# Patient Record
Sex: Male | Born: 1975 | Race: White | Hispanic: No | Marital: Married | Smoking: Never smoker
Health system: Southern US, Community
[De-identification: ages and names within clinical notes are randomized; demographics above are authoritative.]

## PROBLEM LIST (undated history)

## (undated) DIAGNOSIS — L409 Psoriasis, unspecified: Secondary | ICD-10-CM

## (undated) DIAGNOSIS — L739 Follicular disorder, unspecified: Secondary | ICD-10-CM

## (undated) DIAGNOSIS — E119 Type 2 diabetes mellitus without complications: Secondary | ICD-10-CM

## (undated) DIAGNOSIS — R7989 Other specified abnormal findings of blood chemistry: Secondary | ICD-10-CM

## (undated) DIAGNOSIS — G5750 Tarsal tunnel syndrome, unspecified lower limb: Secondary | ICD-10-CM

## (undated) DIAGNOSIS — M5030 Other cervical disc degeneration, unspecified cervical region: Secondary | ICD-10-CM

## (undated) DIAGNOSIS — E669 Obesity, unspecified: Secondary | ICD-10-CM

## (undated) DIAGNOSIS — E785 Hyperlipidemia, unspecified: Secondary | ICD-10-CM

## (undated) DIAGNOSIS — M5020 Other cervical disc displacement, unspecified cervical region: Secondary | ICD-10-CM

## (undated) HISTORY — DX: Hyperlipidemia, unspecified: E78.5

## (undated) HISTORY — DX: Follicular disorder, unspecified: L73.9

## (undated) HISTORY — PX: ANKLE SURGERY: SHX546

## (undated) HISTORY — DX: Obesity, unspecified: E66.9

## (undated) HISTORY — DX: Tarsal tunnel syndrome, unspecified lower limb: G57.50

## (undated) HISTORY — DX: Type 2 diabetes mellitus without complications: E11.9

## (undated) HISTORY — DX: Other cervical disc displacement, unspecified cervical region: M50.20

## (undated) HISTORY — DX: Other specified abnormal findings of blood chemistry: R79.89

## (undated) HISTORY — DX: Other cervical disc degeneration, unspecified cervical region: M50.30

## (undated) HISTORY — DX: Psoriasis, unspecified: L40.9

---

## 2011-03-22 ENCOUNTER — Ambulatory Visit: Payer: Self-pay | Admitting: Internal Medicine

## 2011-03-26 ENCOUNTER — Ambulatory Visit: Payer: Self-pay | Admitting: Internal Medicine

## 2011-06-06 ENCOUNTER — Ambulatory Visit: Payer: Self-pay

## 2014-10-11 ENCOUNTER — Ambulatory Visit: Payer: Self-pay | Admitting: Unknown Physician Specialty

## 2015-02-12 ENCOUNTER — Ambulatory Visit (INDEPENDENT_AMBULATORY_CARE_PROVIDER_SITE_OTHER): Payer: BLUE CROSS/BLUE SHIELD | Admitting: Family Medicine

## 2015-02-12 ENCOUNTER — Encounter: Payer: Self-pay | Admitting: Family Medicine

## 2015-02-12 ENCOUNTER — Other Ambulatory Visit: Payer: Self-pay

## 2015-02-12 VITALS — BP 121/82 | HR 68 | Temp 97.9°F | Ht 72.44 in | Wt 284.0 lb

## 2015-02-12 DIAGNOSIS — H1013 Acute atopic conjunctivitis, bilateral: Secondary | ICD-10-CM

## 2015-02-12 DIAGNOSIS — E1165 Type 2 diabetes mellitus with hyperglycemia: Secondary | ICD-10-CM

## 2015-02-12 DIAGNOSIS — E291 Testicular hypofunction: Secondary | ICD-10-CM

## 2015-02-12 DIAGNOSIS — E669 Obesity, unspecified: Secondary | ICD-10-CM | POA: Diagnosis not present

## 2015-02-12 DIAGNOSIS — J3089 Other allergic rhinitis: Secondary | ICD-10-CM | POA: Diagnosis not present

## 2015-02-12 DIAGNOSIS — E785 Hyperlipidemia, unspecified: Secondary | ICD-10-CM | POA: Insufficient documentation

## 2015-02-12 DIAGNOSIS — R7989 Other specified abnormal findings of blood chemistry: Secondary | ICD-10-CM | POA: Insufficient documentation

## 2015-02-12 DIAGNOSIS — L739 Follicular disorder, unspecified: Secondary | ICD-10-CM | POA: Diagnosis not present

## 2015-02-12 DIAGNOSIS — H1010 Acute atopic conjunctivitis, unspecified eye: Secondary | ICD-10-CM | POA: Insufficient documentation

## 2015-02-12 DIAGNOSIS — J309 Allergic rhinitis, unspecified: Secondary | ICD-10-CM | POA: Insufficient documentation

## 2015-02-12 DIAGNOSIS — IMO0002 Reserved for concepts with insufficient information to code with codable children: Secondary | ICD-10-CM

## 2015-02-12 DIAGNOSIS — L239 Allergic contact dermatitis, unspecified cause: Secondary | ICD-10-CM | POA: Insufficient documentation

## 2015-02-12 DIAGNOSIS — B3742 Candidal balanitis: Secondary | ICD-10-CM | POA: Insufficient documentation

## 2015-02-12 DIAGNOSIS — L200 Besnier's prurigo: Secondary | ICD-10-CM | POA: Diagnosis not present

## 2015-02-12 MED ORDER — GLIMEPIRIDE 4 MG PO TABS: 4.0000 mg | ORAL_TABLET | Freq: Two times a day (BID) | ORAL | Status: DC

## 2015-02-12 MED ORDER — TRIAMCINOLONE ACETONIDE 0.1 % EX CREA: 1.0000 | TOPICAL_CREAM | Freq: Two times a day (BID) | CUTANEOUS | Status: DC | PRN

## 2015-02-12 MED ORDER — METFORMIN HCL ER 500 MG PO TB24: 1500.0000 mg | ORAL_TABLET | Freq: Every day | ORAL | Status: DC

## 2015-02-12 MED ORDER — SIMVASTATIN 40 MG PO TABS: 40.0000 mg | ORAL_TABLET | Freq: Every day | ORAL | Status: DC

## 2015-02-12 NOTE — Telephone Encounter (Signed)
He needs new diabetic testing supplies.

## 2015-02-12 NOTE — Patient Instructions (Addendum)
Please do trim the fingernails pretty close without reaching the quick, obviously Try corticosteroid cream before bed to see if that will turn off the itch Try to limit saturated fats (bacon, sausage, cheese, hot dogs, etc.) Try veggie hot dogs Check out the information at familydoctor.org entitled "What It Takes to Lose Weight" Try to lose between 1-2 pounds per week by taking in fewer calories and burning off more calories You can succeed by limiting portions, limiting foods dense in calories and fat, becoming more active, and drinking 8 glasses of water a day Don't skip meals, especially breakfast, as skipping meals may alter your metabolism Do not use over-the-counter weight loss pills or gimmicks that claim rapid weight loss A healthy BMI (or body mass index) is between 18.5 and 24.9 You can calculate your ideal BMI at the [place] website ClubMonetize.fr If you have not heard anything from my staff in a week about any orders/referrals/studies from today, please contact us here to follow-up [phone]

## 2015-02-12 NOTE — Progress Notes (Signed)
BP 121/82 mmHg  Pulse 68  Temp(Src) 97.9 F (36.6 C)  Ht 6' 0.44" (1.84 m)  Wt 284 lb (128.822 kg)  BMI 38.05 kg/m2  SpO2 98%   Subjective:    Patient ID: Donald Baxter, male    DOB: Dec 06, 1975, 39 y.o.   MRN: [id number]  HPI: Donald Baxter is a 39 y.o. male  Chief Complaint  Patient presents with  . Establish Care  . Allergic Rhinitis   . Diabetes   I need to get my diabetes under control; he has had diabetes since around 2008; numbers stay around 250 and he knows that is high; they've tried all kinds of medicines; doesn't come down much; very stable, no highs and lows; feels like they try to overmedicate him sometimes He broke his last meter; would like meter for OneTouch Veriflex, strips, lancets  Allergies and skin rash; sees dermatologist; does not use fragrance-free detergents; dermatologist gave him some bath soap; just OTC allegra, which helps the sneezing and coughing; he would be interested in getting tested for allergies; two dogs at home, not really long-haired, medium hair; chihuahua sleeps in the bed; older double wide home; thinks there might be mold in the house, owned by father-in-law  Occasional flares of 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 Malvin Johns was his surgeon at [place] to take medicine for low testosterone  Relevant past medical, surgical, family and social history reviewed and updated as indicated. Interim medical history since our last visit reviewed. Allergies and medications reviewed and updated.  Meds:  Aspirin, allegra, amaryl, glucophage 1500 mg, simvastatin 40 (morning)  Review of Systems  Constitutional: Negative for unexpected weight change.  Respiratory: Negative.   Cardiovascular: Negative.   Gastrointestinal: Negative for nausea and abdominal pain.  Musculoskeletal: Negative for neck pain.  Neurological: Negative.    Per HPI unless  specifically indicated above     Objective:    BP 121/82 mmHg  Pulse 68  Temp(Src) 97.9 F (36.6 C)  Ht 6' 0.44" (1.84 m)  Wt 284 lb (128.822 kg)  BMI 38.05 kg/m2  SpO2 98%  Wt Readings from Last 3 Encounters:  02/12/15 284 lb (128.822 kg)    Physical Exam  Constitutional: He appears well-developed and well-nourished. No distress.  Cardiovascular: Normal rate, regular rhythm and normal heart sounds.   Pulmonary/Chest: Effort normal and breath sounds normal.  Neurological: He is alert.  Skin: He is not diaphoretic.  Psychiatric: He has a normal mood and affect. His behavior is normal. Judgment and thought content normal.   Diabetic Foot Form - Detailed   Diabetic Foot Exam - detailed  Diabetic Foot exam was performed with the following findings:  Yes 02/12/2015  9:53 AM  Are the toenails long?:  No  Are the toenails thick?:  No  Are the toenails ingrown?:  No    Pulse Foot Exam completed.:  Yes  Right Dorsalis Pedis:  Present Left Dorsalis Pedis:  Present  Sensory Foot Exam Completed.:  Yes  Semmes-Weinstein Monofilament Test  R Site 1-Great Toe:  Pos L Site 1-Great Toe:  Pos  R Site 4:  Pos L Site 4:  Pos  R Site 5:  Pos L Site 5:  Pos        No results found for this or any previous visit.    Assessment & Plan:   Problem List Items Addressed  This Visit      Respiratory   Allergic rhinitis     Endocrine   Diabetes mellitus with hyperglycemia - Primary   Relevant Medications   aspirin EC 81 MG tablet   metFORMIN (GLUCOPHAGE-XR) 500 MG 24 hr tablet   glimepiride (AMARYL) 4 MG tablet   simvastatin (ZOCOR) 40 MG tablet   Other Relevant Orders   Hgb A1c w/o eAG   Comprehensive metabolic panel     Musculoskeletal and Integument   Folliculitis   Allergic dermatitis     Other   Low testosterone   Obesity   Relevant Medications   metFORMIN (GLUCOPHAGE-XR) 500 MG 24 hr tablet   glimepiride (AMARYL) 4 MG tablet   Other Relevant Orders   TSH   Dyslipidemia    Relevant Medications   simvastatin (ZOCOR) 40 MG tablet   Other Relevant Orders   Lipid Panel w/o Chol/HDL Ratio   Allergic conjunctivitis      Refills to [facility] in [place]  Follow up plan: Return in about 3 months (around 05/15/2015) for diabetes.

## 2015-02-13 ENCOUNTER — Telehealth: Payer: Self-pay | Admitting: Family Medicine

## 2015-02-13 LAB — COMPREHENSIVE METABOLIC PANEL
ALT: 33 IU/L (ref 0–44)
AST: 19 IU/L (ref 0–40)
Albumin/Globulin Ratio: 1.7 (ref 1.1–2.5)
Albumin: 4 g/dL (ref 3.5–5.5)
Alkaline Phosphatase: 68 IU/L (ref 39–117)
BUN/Creatinine Ratio: 12 (ref 9–20)
BUN: 12 mg/dL (ref 6–24)
Bilirubin Total: 0.4 mg/dL (ref 0.0–1.2)
CHLORIDE: 100 mmol/L (ref 97–108)
CO2: 23 mmol/L (ref 18–29)
CREATININE: 1.03 mg/dL (ref 0.76–1.27)
Calcium: 9.3 mg/dL (ref 8.7–10.2)
GFR calc Af Amer: 105 mL/min/{1.73_m2} (ref >59)
GFR calc non Af Amer: 90 mL/min/{1.73_m2} (ref >59)
GLUCOSE: 213 mg/dL — AB (ref 65–99)
Globulin, Total: 2.4 g/dL (ref 1.5–4.5)
POTASSIUM: 4.5 mmol/L (ref 3.5–5.2)
Sodium: 138 mmol/L (ref 134–144)
Total Protein: 6.4 g/dL (ref 6.0–8.5)

## 2015-02-13 LAB — HGB A1C W/O EAG: HEMOGLOBIN A1C: 10.3 % — AB (ref 4.8–5.6)

## 2015-02-13 LAB — LIPID PANEL W/O CHOL/HDL RATIO
CHOLESTEROL TOTAL: 169 mg/dL (ref 100–199)
HDL: 29 mg/dL — ABNORMAL LOW (ref >39)
LDL CALC: 74 mg/dL (ref 0–99)
Triglycerides: 330 mg/dL — ABNORMAL HIGH (ref 0–149)
VLDL CHOLESTEROL CAL: 66 mg/dL — AB (ref 5–40)

## 2015-02-13 LAB — TSH: TSH: 5 u[IU]/mL — ABNORMAL HIGH (ref 0.450–4.500)

## 2015-02-13 MED ORDER — ONETOUCH DELICA LANCETS FINE MISC: Status: DC

## 2015-02-13 MED ORDER — GLUCOSE BLOOD VI STRP: ORAL_STRIP | Status: DC

## 2015-02-13 MED ORDER — METFORMIN HCL ER 500 MG PO TB24: 2000.0000 mg | ORAL_TABLET | Freq: Every day | ORAL | Status: DC

## 2015-02-13 MED ORDER — ONETOUCH VERIO SYNC SYSTEM W/DEVICE KIT: PACK | Status: DC

## 2015-02-13 NOTE — Telephone Encounter (Signed)
Please let the patient know that his blood sugar is really uncontrolled I'd like to see him back in the office in the next two weeks to discuss other medicines In the meantime, I definitely recommend he increase the metformin to FOUR pills a day; I've sent new Rx Have him check his blood sugars once a day on average at different times (maybe before breakfast on Monday, before lunch on Tuesday, before evening meal Wed, before bed Thursday, skip Friday, etc.) I'd like to also do a few readings where he checks his sugar before he eats a meal, then checks it again 90-120 minutes after he started eating Bring all those readings in; they should average about one a day, so hopefully we'll have 14 or so readings to review Cholesterol was abnormal, so we'll go over that too at his appt Appt to go over all labs and adjust meds in 2 weeks

## 2015-02-14 LAB — ALLERGEN PROFILE, MINI-PANEL
Alternaria Alternata IgE: <0.1 kU/L
Bermuda Grass IgE: 1.76 kU/L — AB
Cat Dander IgE: 3.99 kU/L — AB
D001-IGE D PTERONYSSINUS: 76.8 kU/L — AB
D002-IGE D FARINAE: 49.7 kU/L — AB
Dog Dander IgE: 5.72 kU/L — AB
G008-IGE BLUEGRASS, KENTUCK: 16.1 kU/L — AB
Mouse Urine IgE: <0.1 kU/L
Oak, White IgE: <0.1 kU/L
Plantain, English IgE: 0.13 kU/L — AB
W001-IGE RAGWEED, SHORT: 0.6 kU/L — AB

## 2015-02-14 NOTE — Telephone Encounter (Signed)
Patient notified, appointment scheduled

## 2015-02-27 ENCOUNTER — Ambulatory Visit (INDEPENDENT_AMBULATORY_CARE_PROVIDER_SITE_OTHER): Payer: BLUE CROSS/BLUE SHIELD | Admitting: Family Medicine

## 2015-02-27 ENCOUNTER — Encounter: Payer: Self-pay | Admitting: Family Medicine

## 2015-02-27 VITALS — BP 112/79 | HR 76 | Temp 97.8°F | Wt 284.0 lb

## 2015-02-27 DIAGNOSIS — E785 Hyperlipidemia, unspecified: Secondary | ICD-10-CM

## 2015-02-27 DIAGNOSIS — R7989 Other specified abnormal findings of blood chemistry: Secondary | ICD-10-CM | POA: Insufficient documentation

## 2015-02-27 DIAGNOSIS — E669 Obesity, unspecified: Secondary | ICD-10-CM

## 2015-02-27 DIAGNOSIS — E1165 Type 2 diabetes mellitus with hyperglycemia: Secondary | ICD-10-CM

## 2015-02-27 DIAGNOSIS — R946 Abnormal results of thyroid function studies: Secondary | ICD-10-CM

## 2015-02-27 MED ORDER — CANAGLIFLOZIN 100 MG PO TABS: 100.0000 mg | ORAL_TABLET | Freq: Every day | ORAL | Status: DC

## 2015-02-27 NOTE — Progress Notes (Signed)
BP 112/79 mmHg  Pulse 76  Temp(Src) 97.8 F (36.6 C)  Wt 284 lb (128.822 kg)  SpO2 99%   Subjective:    Patient ID: Donald Baxter, male    DOB: [birth year], 39 y.o.   MRN: [id number]  HPI: CARLOSDANIEL Baxter is a 39 y.o. male  Chief Complaint  Patient presents with  . Diabetes  . Hyperlipidemia  Patient established care here  Diabetes mellitus type 2 Patient has had diabetes since 2008  Checking sugars?  yes How often? Pretty regularly, may skip a few days here and there Range (low to high) over last two weeks:  178 lowest and 326 highest; most below 250; lots in the 220-240s Does patient feel additional teaching/training would be helpful?  no; he already talked to a nutritionist; he can do the small things Some struggles, doesn't have time to eat meals except for grab and go; then has meal at home in the evening; can eat nuts out of vending machines, easy to grab, breakfast is harder because snacks get in there; hard to find a grab and go breakfast; tried muffins, not a large section of healthy foods that he can find; doesn't have time to cook; he gets tired of the same thing  Trying to limit white bread, white rice, white potatoes, sweets?  yes; always does brown rice; potatoes are the hardest, breads are pretty hard if he goes out to eat  Trying to limit sweetened drinks like iced tea, soft drinks, sports drinks, fruit juices?  yes; tries to stick to diet Coke Exercise/activity level:  occasional (maybe once or twice a week); not really regular exercise; does have a treadmill  Lab Results  Component Value Date   HGBA1C 10.3* 02/12/2015   High cholesterol Patient has had high cholesterol since many years Dietary intake: Do you eat more than 3 eggs per week  YES at times, maybe breakfast Do you try to limit saturated fats in your diet?  yes Does high cholesterol run in your family?   YES Weight:  If overweight or obese, are you trying to lose weight?  NO, not really Went from  315 pounds to 275 pounds; that was stress  Relevant past medical, surgical, family and social history reviewed and updated as indicated. Interim medical history since our last visit reviewed. Allergies and medications reviewed and updated.  Review of Systems  Per HPI unless specifically indicated above     Objective:    BP 112/79 mmHg  Pulse 76  Temp(Src) 97.8 F (36.6 C)  Wt 284 lb (128.822 kg)  SpO2 99%  Wt Readings from Last 3 Encounters:  02/27/15 284 lb (128.822 kg)  02/12/15 284 lb (128.822 kg)    Physical Exam  Constitutional: He appears well-developed and well-nourished. No distress.  obese  HENT:  Head: Normocephalic and atraumatic.  Eyes: EOM are normal. No scleral icterus.  Neck: No thyromegaly present.  Cardiovascular: Normal rate.   Pulmonary/Chest: Effort normal.  Abdominal: He exhibits no distension.  Musculoskeletal: He exhibits no edema.  Neurological: Coordination normal.  Skin: Skin is warm and dry. Rash (erythematous rash on the right leg (psoriasis)) noted. He is not diaphoretic.  Psychiatric: He has a normal mood and affect. His behavior is normal. Judgment and thought content normal.    Diabetic Foot Form - Detailed   Diabetic Foot Exam - detailed  Diabetic Foot exam was performed with the following findings:  Yes 02/27/2015 10:12 AM  Visual Foot Exam completed.:  Yes  Is there a history of foot ulcer?:  No  Are the toenails long?:  No  Are the toenails thick?:  No  Are the toenails ingrown?:  No    Pulse Foot Exam completed.:  Yes  Right Dorsalis Pedis:  Present Left Dorsalis Pedis:  Present  Semmes-Weinstein Monofilament Test  R Site 1-Great Toe:  Pos L Site 1-Great Toe:  Pos  R Site 4:  Pos L Site 4:  Pos    Comments:  Intact to vibration and monofilament testing both feet       Results for orders placed or performed in visit on 02/12/15  Hgb A1c w/o eAG  Result Value Ref Range   Hgb A1c MFr Bld 10.3 (H) 4.8 - 5.6 %  Lipid Panel  w/o Chol/HDL Ratio  Result Value Ref Range   Cholesterol, Total 169 100 - 199 mg/dL   Triglycerides 330 (H) 0 - 149 mg/dL   HDL 29 (L) >39 mg/dL   VLDL Cholesterol Cal 66 (H) 5 - 40 mg/dL   LDL Calculated 74 0 - 99 mg/dL  Comprehensive metabolic panel  Result Value Ref Range   Glucose 213 (H) 65 - 99 mg/dL   BUN 12 6 - 24 mg/dL   Creatinine, Ser 1.03 0.76 - 1.27 mg/dL   GFR calc non Af Amer 90 >59 mL/min/1.73   GFR calc Af Amer 105 >59 mL/min/1.73   BUN/Creatinine Ratio 12 9 - 20   Sodium 138 134 - 144 mmol/L   Potassium 4.5 3.5 - 5.2 mmol/L   Chloride 100 97 - 108 mmol/L   CO2 23 18 - 29 mmol/L   Calcium 9.3 8.7 - 10.2 mg/dL   Total Protein 6.4 6.0 - 8.5 g/dL   Albumin 4.0 3.5 - 5.5 g/dL   Globulin, Total 2.4 1.5 - 4.5 g/dL   Albumin/Globulin Ratio 1.7 1.1 - 2.5   Bilirubin Total 0.4 0.0 - 1.2 mg/dL   Alkaline Phosphatase 68 39 - 117 IU/L   AST 19 0 - 40 IU/L   ALT 33 0 - 44 IU/L  TSH  Result Value Ref Range   TSH 5.000 (H) 0.450 - 4.500 uIU/mL      Assessment & Plan:   Problem List Items Addressed This Visit      Endocrine   Diabetes mellitus with hyperglycemia - Primary    Encouraged healthy eating; limit white bread, continue metformin and amaryl; add invokana; savings card and 2 bottles of samples given; recheck A1C at follow-up; explained A1C concept, importance of getting to goal (under 7); increase activity      Relevant Medications   canagliflozin (INVOKANA) 100 MG TABS tablet     Other   Obesity    Will add invokana to see if this will help with weight loss and bring A1C down; encouraged activity; he commits to 30 minutes on the treadmill 3 days per week, healthy eating options discussed      Relevant Medications   canagliflozin (INVOKANA) 100 MG TABS tablet   Dyslipidemia    Continue fish oil to help, try healthier food options, ideas given      Abnormal thyroid blood test    Recheck thyroid at next visit in 3 months         Follow up  plan: Return in about 3 months (around 05/30/2015) for fasting labs and visit for diabetes and cholesterol.

## 2015-02-27 NOTE — Assessment & Plan Note (Signed)
Continue fish oil to help, try healthier food options, ideas given

## 2015-02-27 NOTE — Assessment & Plan Note (Addendum)
Encouraged healthy eating; limit white bread, continue metformin and amaryl; add invokana; savings card and 2 bottles of samples given; recheck A1C at follow-up; explained A1C concept, importance of getting to goal (under 7); increase activity

## 2015-02-27 NOTE — Assessment & Plan Note (Signed)
Recheck thyroid at next visit in 3 months

## 2015-02-27 NOTE — Assessment & Plan Note (Signed)
Will add invokana to see if this will help with weight loss and bring A1C down; encouraged activity; he commits to 30 minutes on the treadmill 3 days per week, healthy eating options discussed

## 2015-02-27 NOTE — Patient Instructions (Signed)
Do try some of the things we discussed Start new medicine Continue other medicines Try to walk for 30 minutes 3 times a week, and build up gradually if able; start slow and build up Return in 3 months, come fasting for labs If ever get really sick or dehydrated, do NOT take metformin or new medicine

## 2015-03-05 ENCOUNTER — Encounter: Payer: Self-pay | Admitting: Family Medicine

## 2015-05-15 ENCOUNTER — Ambulatory Visit: Payer: BLUE CROSS/BLUE SHIELD | Admitting: Family Medicine

## 2015-05-29 ENCOUNTER — Ambulatory Visit: Payer: BLUE CROSS/BLUE SHIELD | Admitting: Family Medicine

## 2015-06-04 ENCOUNTER — Ambulatory Visit (INDEPENDENT_AMBULATORY_CARE_PROVIDER_SITE_OTHER): Payer: BLUE CROSS/BLUE SHIELD | Admitting: Family Medicine

## 2015-06-04 ENCOUNTER — Encounter: Payer: Self-pay | Admitting: Family Medicine

## 2015-06-04 VITALS — BP 109/76 | HR 74 | Temp 97.2°F | Wt 284.0 lb

## 2015-06-04 DIAGNOSIS — E1165 Type 2 diabetes mellitus with hyperglycemia: Secondary | ICD-10-CM

## 2015-06-04 DIAGNOSIS — E669 Obesity, unspecified: Secondary | ICD-10-CM | POA: Diagnosis not present

## 2015-06-04 DIAGNOSIS — R946 Abnormal results of thyroid function studies: Secondary | ICD-10-CM | POA: Diagnosis not present

## 2015-06-04 DIAGNOSIS — M7989 Other specified soft tissue disorders: Secondary | ICD-10-CM | POA: Diagnosis not present

## 2015-06-04 DIAGNOSIS — H1013 Acute atopic conjunctivitis, bilateral: Secondary | ICD-10-CM

## 2015-06-04 DIAGNOSIS — R7989 Other specified abnormal findings of blood chemistry: Secondary | ICD-10-CM

## 2015-06-04 DIAGNOSIS — E785 Hyperlipidemia, unspecified: Secondary | ICD-10-CM | POA: Diagnosis not present

## 2015-06-04 NOTE — Progress Notes (Signed)
BP 109/76 mmHg  Pulse 74  Temp(Src) 97.2 F (36.2 C)  Wt 284 lb (128.822 kg)  SpO2 98%   Subjective:    Patient ID: Donald Baxter, male    DOB: 07/19/1976, 39 y.o.   MRN: [id number]  HPI: Donald Baxter is a 39 y.o. male  Chief Complaint  Patient presents with  . Diabetes  . Hyperlipidemia   Diabetes mellitus type 2 Patient has had diabetes since 2008 Checking sugars? yes How often? Pretty regularly, may skip a few days here and there Feels jittery when under 120; knows to eat when jittery; no extreme lows Expecting blood sugar to be much better Active at work Last eye exam was December; he won't go back there and he'll call to make his own appt with someone else  High cholesterol Patient has had high cholesterol since many years Dietary intake: Do you eat more than 3 eggs per week YES at times, maybe breakfast Do you try to limit saturated fats in your diet? yes Does high cholesterol run in your family? YES Weight:  If overweight or obese, are you trying to lose weight? NO, not really He feels like he is stuck in a plateau with his weight He tries to eat healthy when eating out, like subway over [employer], e.g.  Lab Results  Component Value Date   HGBA1C 10.3* 02/12/2015     Chemistry      Component Value Date/Time   NA 138 02/12/2015 0947   K 4.5 02/12/2015 0947   CL 100 02/12/2015 0947   CO2 23 02/12/2015 0947   BUN 12 02/12/2015 0947   CREATININE 1.03 02/12/2015 0947      Component Value Date/Time   CALCIUM 9.3 02/12/2015 0947   ALKPHOS 68 02/12/2015 0947   AST 19 02/12/2015 0947   ALT 33 02/12/2015 0947   BILITOT 0.4 02/12/2015 0947     Lab Results  Component Value Date   CHOL 169 02/12/2015   HDL 29* 02/12/2015   LDLCALC 74 02/12/2015   TRIG 330* 02/12/2015    Relevant past medical, surgical, family and social history reviewed and updated as indicated. Interim medical history since our last visit reviewed. Allergies and  medications reviewed and updated.  Review of Systems  Respiratory: Negative for shortness of breath.   Musculoskeletal: Positive for joint swelling (swelling near left elbow).  Skin:       Rash, allergic dermatitis, redness   He has a swollen area just distal to his left elbow; rests his arm on things; not going away Per HPI unless specifically indicated above     Objective:    BP 109/76 mmHg  Pulse 74  Temp(Src) 97.2 F (36.2 C)  Wt 284 lb (128.822 kg)  SpO2 98%  Wt Readings from Last 3 Encounters:  06/04/15 284 lb (128.822 kg)  02/27/15 284 lb (128.822 kg)  02/12/15 284 lb (128.822 kg)    Physical Exam  Constitutional: He appears well-developed and well-nourished. No distress.  obese  HENT:  Head: Normocephalic and atraumatic.  Eyes: EOM are normal. No scleral icterus.  Neck: No thyromegaly present.  Cardiovascular: Normal rate.   Pulmonary/Chest: Effort normal.  Abdominal: He exhibits no distension.  Musculoskeletal: He exhibits no edema.  Discrete area of soft swelling just distal to left olecranon; only mild erythema; mobile, no increased warmth  Neurological: Coordination normal.  Skin: Skin is warm and dry. Rash (erythematous rash on the legs, arms) noted. He is not diaphoretic.  Psychiatric:  He has a normal mood and affect. His behavior is normal. Judgment and thought content normal.   Diabetic Foot Form - Detailed   Diabetic Foot Exam - detailed  Diabetic Foot exam was performed with the following findings:  Yes 06/04/2015  9:46 AM  Visual Foot Exam completed.:  Yes  Is there a history of foot ulcer?:  No  Are the shoes appropriate in style and fit?:  Yes  Is there swelling or and abnormal foot shape?:  No  Are the toenails long?:  No  Are the toenails thick?:  No  Is there elevated skin temparature?:  No  Are the toenails ingrown?:  No  Normal Range of Motion:  Yes    Pulse Foot Exam completed.:  Yes  Right Dorsalis Pedis:  Present Left Dorsalis Pedis:   Present  Sensory Foot Exam Completed.:  Yes  Swelling:  No  Semmes-Weinstein Monofilament Test  R Site 1-Great Toe:  Pos L Site 1-Great Toe:  Pos  R Site 4:  Pos L Site 4:  Pos  R Site 5:  Pos L Site 5:  Pos        Results for orders placed or performed in visit on 02/12/15  Hgb A1c w/o eAG  Result Value Ref Range   Hgb A1c MFr Bld 10.3 (H) 4.8 - 5.6 %  Lipid Panel w/o Chol/HDL Ratio  Result Value Ref Range   Cholesterol, Total 169 100 - 199 mg/dL   Triglycerides 330 (H) 0 - 149 mg/dL   HDL 29 (L) >39 mg/dL   VLDL Cholesterol Cal 66 (H) 5 - 40 mg/dL   LDL Calculated 74 0 - 99 mg/dL  Comprehensive metabolic panel  Result Value Ref Range   Glucose 213 (H) 65 - 99 mg/dL   BUN 12 6 - 24 mg/dL   Creatinine, Ser 1.03 0.76 - 1.27 mg/dL   GFR calc non Af Amer 90 >59 mL/min/1.73   GFR calc Af Amer 105 >59 mL/min/1.73   BUN/Creatinine Ratio 12 9 - 20   Sodium 138 134 - 144 mmol/L   Potassium 4.5 3.5 - 5.2 mmol/L   Chloride 100 97 - 108 mmol/L   CO2 23 18 - 29 mmol/L   Calcium 9.3 8.7 - 10.2 mg/dL   Total Protein 6.4 6.0 - 8.5 g/dL   Albumin 4.0 3.5 - 5.5 g/dL   Globulin, Total 2.4 1.5 - 4.5 g/dL   Albumin/Globulin Ratio 1.7 1.1 - 2.5   Bilirubin Total 0.4 0.0 - 1.2 mg/dL   Alkaline Phosphatase 68 39 - 117 IU/L   AST 19 0 - 40 IU/L   ALT 33 0 - 44 IU/L  TSH  Result Value Ref Range   TSH 5.000 (H) 0.450 - 4.500 uIU/mL  Allergen Profile, Mini-Panel  Result Value Ref Range   Class Description Comment    D Pteronyssinus IgE 76.80 (A) Class V kU/L   D Farinae IgE 49.70 (A) Class V kU/L   Cat Dander IgE 3.99 (A) Class IV kU/L   Dog Dander IgE 5.72 (A) Class IV kU/L   [place] Grass IgE 1.76 (A) Class III kU/L   [place] Bluegrass IgE 16.10 (A) Class IV kU/L   Alternaria Alternata IgE <0.10 Class 0 kU/L   Oak, White IgE <0.10 Class 0 kU/L   Elm, American IgE <0.10 Class 0 kU/L   Ragweed, Short IgE 0.60 (A) Class II kU/L   Plantain, English IgE 0.13 (A) Class 0/I kU/L   Mouse  Urine  IgE <0.10 Class 0 kU/L      Assessment & Plan:   Problem List Items Addressed This Visit      Endocrine   Diabetes mellitus with hyperglycemia ([place]) - Primary    Check feet nightly; foot exam by MD today; check A1C today; limit sweets, etc.; eye visit yearly; stop sulfonylurea and continue metformin and invokana      Relevant Orders   Hgb A1c w/o eAG   Basic metabolic panel     Other   Obesity    Work on weight loss; check TSH today to make sure he's not developing hypothyroidism, which would compromise his weight loss efforts      Dyslipidemia    Limit saturated fats; check cholesterol this morning; continue omega-3 fish oil; work on weight loss      Relevant Orders   Lipid Panel w/o Chol/HDL Ratio   Allergic conjunctivitis    Not much of a bother; offered eye drops, he declined, he may call if worsening      Abnormal thyroid blood test    Check TSH again today, may be developing hypothyroidism      Relevant Orders   TSH   Left arm swelling    Discrete area of swelling left arm near elbow; ddx includes bursa or lipoma or cyst; avoid all pressure for one month, and if bursa, should resolve; if not, then will persist and I can refer him to a surgeon; call me sooner if growing or becoming sore         Follow up plan: Return in about 3 months (around 09/04/2015) for diabetes, high cholesterol. Orders Placed This Encounter  Procedures  . TSH  . Lipid Panel w/o Chol/HDL Ratio  . Hgb A1c w/o eAG  . Basic metabolic panel   An after-visit summary was printed and given to the patient at [facility].  Please see the patient instructions which may contain other information and recommendations beyond what is mentioned above in the assessment and plan.

## 2015-06-04 NOTE — Assessment & Plan Note (Signed)
Check feet nightly; foot exam by MD today; check A1C today; limit sweets, etc.; eye visit yearly; stop sulfonylurea and continue metformin and invokana

## 2015-06-04 NOTE — Assessment & Plan Note (Signed)
Check TSH again today, may be developing hypothyroidism

## 2015-06-04 NOTE — Patient Instructions (Addendum)
Continue to stay active STOP the glimepiride Aim for 6,000 steps a day We'll contact you about the labs Please do schedule an appointment to see your eye doctor, and have your eyes examined every year Check your feet every night and let me know right away of any sores, infections, numbness, etc. Try to limit sweets, white bread, white rice, white potatoes It is okay for you to not check your fingerstick blood sugars (per [employer] of Endocrinology Best Practices), but check if symptomatic or you desire Try to limit saturated fats in your diet (bologna, hot dogs, barbeque, cheeseburgers, hamburgers, steak, bacon, sausage, cheese, etc.) and get more fresh fruits, vegetables, and whole grains Check out the information at familydoctor.org entitled "What It Takes to Lose Weight" Try to lose between 1-2 pounds per week by taking in fewer calories and burning off more calories You can succeed by limiting portions, limiting foods dense in calories and fat, becoming more active, and drinking 8 glasses of water a day (64 ounces) Don't skip meals, especially breakfast, as skipping meals may alter your metabolism Do not use over-the-counter weight loss pills or gimmicks that claim rapid weight loss A healthy BMI (or body mass index) is between 18.5 and 24.9 You can calculate your ideal BMI at the [place] website [employer]Mon[employer].fr Try to avoid ALL pressure against the left arm; if not gone in one month, call me and we'll refer you to a surgeon; call me sooner if it gets bigger or sore and we'll send you to get that evaluated Return in 3 months, sooner if needed for other issues

## 2015-06-04 NOTE — Assessment & Plan Note (Signed)
Not much of a bother; offered eye drops, he declined, he may call if worsening

## 2015-06-04 NOTE — Assessment & Plan Note (Signed)
Discrete area of swelling left arm near elbow; ddx includes bursa or lipoma or cyst; avoid all pressure for one month, and if bursa, should resolve; if not, then will persist and I can refer him to a surgeon; call me sooner if growing or becoming sore

## 2015-06-04 NOTE — Assessment & Plan Note (Signed)
Limit saturated fats; check cholesterol this morning; continue omega-3 fish oil; work on weight loss

## 2015-06-04 NOTE — Assessment & Plan Note (Signed)
Work on weight loss; check TSH today to make sure he's not developing hypothyroidism, which would compromise his weight loss efforts

## 2015-06-05 LAB — TSH: TSH: 2.94 u[IU]/mL (ref 0.450–4.500)

## 2015-06-05 LAB — LIPID PANEL W/O CHOL/HDL RATIO
Cholesterol, Total: 156 mg/dL (ref 100–199)
HDL: 30 mg/dL — AB (ref >39)
LDL Calculated: 65 mg/dL (ref 0–99)
TRIGLYCERIDES: 307 mg/dL — AB (ref 0–149)
VLDL Cholesterol Cal: 61 mg/dL — ABNORMAL HIGH (ref 5–40)

## 2015-06-05 LAB — BASIC METABOLIC PANEL
BUN / CREAT RATIO: 13 (ref 9–20)
BUN: 15 mg/dL (ref 6–24)
CO2: 21 mmol/L (ref 18–29)
CREATININE: 1.13 mg/dL (ref 0.76–1.27)
Calcium: 9.3 mg/dL (ref 8.7–10.2)
Chloride: 104 mmol/L (ref 97–106)
GFR, EST AFRICAN AMERICAN: 93 mL/min/{1.73_m2} (ref >59)
GFR, EST NON AFRICAN AMERICAN: 81 mL/min/{1.73_m2} (ref >59)
Glucose: 134 mg/dL — ABNORMAL HIGH (ref 65–99)
Potassium: 4.7 mmol/L (ref 3.5–5.2)
Sodium: 141 mmol/L (ref 136–144)

## 2015-06-05 LAB — HGB A1C W/O EAG: Hgb A1c MFr Bld: 8.7 % — ABNORMAL HIGH (ref 4.8–5.6)

## 2015-06-13 ENCOUNTER — Telehealth: Payer: Self-pay | Admitting: Family Medicine

## 2015-06-13 NOTE — Telephone Encounter (Signed)
Left brief msg, calling about labs

## 2015-07-02 ENCOUNTER — Other Ambulatory Visit: Payer: Self-pay | Admitting: Family Medicine

## 2015-07-02 MED ORDER — CANAGLIFLOZIN 300 MG PO TABS: 300.0000 mg | ORAL_TABLET | Freq: Every day | ORAL | Status: DC

## 2015-07-02 NOTE — Telephone Encounter (Signed)
rx

## 2015-07-02 NOTE — Telephone Encounter (Signed)
Let patient know that I'd like to increase his Invokana; he was on 100 mg and we'll go to the next higher dose, 300 mg daily I sent in new Rx A1C has come down, but still room for improvement; the higher dose may also help with weight loss

## 2015-07-03 NOTE — Telephone Encounter (Signed)
See other note

## 2015-07-21 ENCOUNTER — Encounter: Payer: Self-pay | Admitting: Gynecology

## 2015-07-21 ENCOUNTER — Ambulatory Visit
Admission: EM | Admit: 2015-07-21 | Discharge: 2015-07-21 | Disposition: A | Discharge status: Home / Self Care | Payer: BLUE CROSS/BLUE SHIELD | Attending: Internal Medicine | Admitting: Internal Medicine

## 2015-07-21 DIAGNOSIS — J029 Acute pharyngitis, unspecified: Secondary | ICD-10-CM | POA: Diagnosis not present

## 2015-07-21 DIAGNOSIS — J069 Acute upper respiratory infection, unspecified: Secondary | ICD-10-CM

## 2015-07-21 LAB — RAPID STREP SCREEN (MED CTR MEBANE ONLY): Streptococcus, Group A Screen (Direct): NEGATIVE

## 2015-07-21 MED ORDER — LIDOCAINE VISCOUS 2 % MT SOLN: 10.0000 mL | Freq: Four times a day (QID) | OROMUCOSAL | Status: DC | PRN

## 2015-07-21 MED ORDER — GUAIFENESIN-CODEINE 100-10 MG/5ML PO SOLN: 10.0000 mL | Freq: Three times a day (TID) | ORAL | Status: DC | PRN

## 2015-07-21 NOTE — Discharge Instructions (Signed)
Take medication as prescribed. Rest. Drink plenty of fluids. Take over the counter tylenol or ibuprofen as needed.   Follow up with your primary care physician this week as needed. Return to Urgent care as needed for new or worsening concerns.   Pharyngitis Pharyngitis is redness, pain, and swelling (inflammation) of your pharynx.  CAUSES  Pharyngitis is usually caused by infection. Most of the time, these infections are from viruses (viral) and are part of a cold. However, sometimes pharyngitis is caused by bacteria (bacterial). Pharyngitis can also be caused by allergies. Viral pharyngitis may be spread from person to person by coughing, sneezing, and personal items or utensils (cups, forks, spoons, toothbrushes). Bacterial pharyngitis may be spread from person to person by more intimate contact, such as kissing.  SIGNS AND SYMPTOMS  Symptoms of pharyngitis include:   Sore throat.   Tiredness (fatigue).   Low-grade fever.   Headache.  Joint pain and muscle aches.  Skin rashes.  Swollen lymph nodes.  Plaque-like film on throat or tonsils (often seen with bacterial pharyngitis). DIAGNOSIS  Your health care provider will ask you questions about your illness and your symptoms. Your medical history, along with a physical exam, is often all that is needed to diagnose pharyngitis. Sometimes, a rapid strep test is done. Other lab tests may also be done, depending on the suspected cause.  TREATMENT  Viral pharyngitis will usually get better in 3-4 days without the use of medicine. Bacterial pharyngitis is treated with medicines that kill germs (antibiotics).  HOME CARE INSTRUCTIONS   Drink enough water and fluids to keep your urine clear or pale yellow.   Only take over-the-counter or prescription medicines as directed by your health care provider:   If you are prescribed antibiotics, make sure you finish them even if you start to feel better.   Do not take aspirin.   Get  lots of rest.   Gargle with 8 oz of salt water ( tsp of salt per 1 qt of water) as often as every 1-2 hours to soothe your throat.   Throat lozenges (if you are not at risk for choking) or sprays may be used to soothe your throat. SEEK MEDICAL CARE IF:   You have large, tender lumps in your neck.  You have a rash.  You cough up green, yellow-brown, or bloody spit. SEEK IMMEDIATE MEDICAL CARE IF:   Your neck becomes stiff.  You drool or are unable to swallow liquids.  You vomit or are unable to keep medicines or liquids down.  You have severe pain that does not go away with the use of recommended medicines.  You have trouble breathing (not caused by a stuffy nose). MAKE SURE YOU:   Understand these instructions.  Will watch your condition.  Will get help right away if you are not doing well or get worse.   This information is not intended to replace advice given to you by your health care provider. Make sure you discuss any questions you have with your health care provider.   Document Released: 08/03/2005 Document Revised: 05/24/2013 Document Reviewed: 04/10/2013 Elsevier Interactive Patient Education 2016 Elsevier Inc.  Upper Respiratory Infection, Adult Most upper respiratory infections (URIs) are caused by a virus. A URI affects the nose, throat, and upper air passages. The most common type of URI is often called "the common cold." HOME CARE   Take medicines only as told by your doctor.  Gargle warm saltwater or take cough drops to comfort your throat  as told by your doctor.  Use a warm mist humidifier or inhale steam from a shower to increase air moisture. This may make it easier to breathe.  Drink enough fluid to keep your pee (urine) clear or pale yellow.  Eat soups and other clear broths.  Have a healthy diet.  Rest as needed.  Go back to work when your fever is gone or your doctor says it is okay.  You may need to stay home longer to avoid giving  your URI to others.  You can also wear a face mask and wash your hands often to prevent spread of the virus.  Use your inhaler more if you have asthma.  Do not use any tobacco products, including cigarettes, chewing tobacco, or electronic cigarettes. If you need help quitting, ask your doctor. GET HELP IF:  You are getting worse, not better.  Your symptoms are not helped by medicine.  You have chills.  You are getting more short of breath.  You have brown or red mucus.  You have yellow or brown discharge from your nose.  You have pain in your face, especially when you bend forward.  You have a fever.  You have puffy (swollen) neck glands.  You have pain while swallowing.  You have white areas in the back of your throat. GET HELP RIGHT AWAY IF:   You have very bad or constant:  Headache.  Ear pain.  Pain in your forehead, behind your eyes, and over your cheekbones (sinus pain).  Chest pain.  You have long-lasting (chronic) lung disease and any of the following:  Wheezing.  Long-lasting cough.  Coughing up blood.  A change in your usual mucus.  You have a stiff neck.  You have changes in your:  Vision.  Hearing.  Thinking.  Mood. MAKE SURE YOU:   Understand these instructions.  Will watch your condition.  Will get help right away if you are not doing well or get worse.   This information is not intended to replace advice given to you by your health care provider. Make sure you discuss any questions you have with your health care provider.   Document Released: 01/20/2008 Document Revised: 12/18/2014 Document Reviewed: 11/08/2013 Elsevier Interactive Patient Education [employer].

## 2015-07-21 NOTE — ED Provider Notes (Signed)
[facility]  ____________________________________________  Time seen: Approximately 10:13 AM  I have reviewed the triage vital signs and the nursing notes.   HISTORY  Chief Complaint Cough and Sore Throat   HPI Donald Baxter is a 39 y.o. male presents for complaints of 4 days of sore throat, runny nose, congestion and intermittent cough. States cough mostly at night, and can feel drainage in back of throat. Denies chest pain or shortness of breath or abdominal pain. Reports continues to eat and drink foods and fluids well. States gradual onset. States cough makes her throat hurt worse.  States sore throat 4/10 scratchy and sore. States sore throat helped with cold fluids and over the counter medications. Denies wheezes. Denies fevers. Reports multiple sick contacts at work.   Past Medical History  Diagnosis Date  . Obesity   . Psoriasis   . Tarsal tunnel syndrome   . Low testosterone   . Diabetes mellitus without complication ([place])   . Hyperlipidemia   . Folliculitis   . Bulge of cervical disc without myelopathy     between c3-c4    Patient Active Problem List   Diagnosis Date Noted  . Left arm swelling 06/04/2015  . Abnormal thyroid blood test 02/27/2015  . Low testosterone 02/12/2015  . Obesity 02/12/2015  . Diabetes mellitus with hyperglycemia ([place]) 02/12/2015  . Folliculitis 96/28/3662  . Allergic dermatitis 02/12/2015  . Dyslipidemia 02/12/2015  . Allergic rhinitis 02/12/2015  . Allergic conjunctivitis 02/12/2015    Past Surgical History  Procedure Laterality Date  . Ankle surgery Bilateral     tarsal tunnel     Current Outpatient Rx  Name  Route  Sig  Dispense  Refill  . aspirin EC 81 MG tablet   Oral   Take 81 mg by mouth daily.         . Blood Glucose Monitoring Suppl ([place]) W/DEVICE KIT      Patient asked for VERIFLEX if that exists; staff found VERIO; please correct if that type exists; dx uncontrolled type 2 DM   1 kit   0   . canagliflozin (INVOKANA) 300 MG TABS tablet   Oral   Take 300 mg by mouth daily before breakfast. New higher dose   30 tablet   5   . fexofenadine (ALLEGRA) 180 MG tablet   Oral   Take 180 mg by mouth 2 (two) times daily.         Marland Kitchen glucose blood (ONETOUCH VERIO) test strip      Check fingerstick blood sugars once a day on average; uncontrolled type 2 dm with hyperglycemia   100 each   1   . metFORMIN (GLUCOPHAGE-XR) 500 MG 24 hr tablet      TAKE FOUR TABLETS BY MOUTH ONCE DAILY   120 tablet   5   . Omega-3 1000 MG CAPS   Oral   Take 1,200 mg by mouth 2 (two) times daily.         Glory Rosebush DELICA LANCETS FINE MISC      Check fingerstick blood sugars once a day on average; uncontrolled type 2 dm with hyperglycemia   100 each   1   . simvastatin (ZOCOR) 40 MG tablet   Oral   Take 1 tablet (40 mg total) by mouth at bedtime.   30 tablet   3   . triamcinolone cream (KENALOG) 0.1 %   Topical   Apply 1 application topically 2 (two) times daily  as needed.   45 g   1     Allergies Review of patient's allergies indicates no known allergies.  Family History  Problem Relation Age of Onset  . Diabetes Mother   . Hypertension Mother   . Hyperlipidemia Mother   . Diabetes Father   . Hypertension Father   . Hyperlipidemia Father   . Cancer Maternal Grandmother   . Stroke Maternal Grandmother   . Cancer Paternal Grandmother     lung?  . Stroke Paternal Grandmother     Social History Social History  Substance Use Topics  . Smoking status: Never Smoker   . Smokeless tobacco: Never Used  . Alcohol Use: Yes     Comment: occasional    Review of Systems Constitutional: No fever/chills Eyes: No visual changes. ENT: Positive sore throat, runny nose, nasal congestion and intermittent cough. Cardiovascular: Denies chest pain. Respiratory: Denies shortness of breath. Gastrointestinal: No abdominal pain.  No nausea, no vomiting.  No diarrhea.   No constipation. Genitourinary: Negative for dysuria. Musculoskeletal: Negative for back pain. Skin: Negative for rash. Neurological: Negative for headaches, focal weakness or numbness.  10-point ROS otherwise negative.  ____________________________________________   PHYSICAL EXAM:  VITAL SIGNS: ED Triage Vitals  Enc Vitals Group     BP 07/21/15 1008 106/75 mmHg     Pulse Rate 07/21/15 1008 89     Resp 07/21/15 1008 18     Temp 07/21/15 1008 98 F (36.7 C)     Temp Source 07/21/15 1008 Oral     SpO2 07/21/15 1008 98 %     Weight 07/21/15 1008 285 lb (129.275 kg)     Height 07/21/15 1008 6' (1.829 m)     Head Cir --      Peak Flow --      Pain Score 07/21/15 1007 2     Pain Loc --      Pain Edu? --      Excl. in [place]? --     Constitutional: Alert and oriented. Well appearing and in no acute distress. Eyes: Conjunctivae are normal. PERRL. EOMI. Head: Atraumatic. No sinus tenderness to palpation. No swelling. No erythema.  Ears: no erythema, normal TMs bilaterally.   Nose: Nasal congestion with clear rhinorrhea.  Mouth/Throat: Mucous membranes are moist.  Mild pharyngeal erythema. No tonsillar swelling or exudate. No uvular shift or deviation. Neck: No stridor.  No cervical spine tenderness to palpation. Hematological/Lymphatic/Immunilogical: No cervical lymphadenopathy. Cardiovascular: Normal rate, regular rhythm. Grossly normal heart sounds.  Good peripheral circulation. Respiratory: Normal respiratory effort.  No retractions. Lungs CTAB. No wheezes, rales or rhonchi. Good air movement. Gastrointestinal: Soft and nontender. Obese abdomen.. Normal Bowel sounds.   No CVA tenderness. Musculoskeletal: No lower or upper extremity tenderness nor edema.  No joint effusions. Bilateral pedal pulses equal and easily palpated.  Neurologic:  Normal speech and language. No gross focal neurologic deficits are appreciated. No gait instability. Skin:  Skin is warm, dry and intact. No rash  noted. Psychiatric: Mood and affect are normal. Speech and behavior are normal.  ____________________________________________   LABS (all labs ordered are listed, but only abnormal results are displayed)  Labs Reviewed  RAPID STREP SCREEN (NOT AT [facility])  CULTURE, GROUP A STREP ([facility] ONLY)  ________________________________________   INITIAL IMPRESSION / ASSESSMENT AND PLAN / ED COURSE  Pertinent labs & imaging results that were available during my care of the patient were reviewed by me and considered in my medical decision making (see chart for  details).  Very well-appearing patient. No acute distress. Presents for complaints of gradual onset 4 days of runny nose, nasal congestion and sore throat and intermittent cough. States initially sore throat mild but has worsened with cough present. Reports multiple sick contacts at work. Lungs clear throughout. Very well-appearing patient. Suspect viral illness. Will swab for strep throat. Strep negative, will culture.   Discussed supportive treatments. Will treat supportively and symptomatically. Will treat with when necessary viscous lidocaine gargle as well as when necessary guaifenesin and codeine. Encouraged rest, fluids, over-the-counter Tylenol or ibuprofen as well as PCP follow up.  Discussed follow up with Primary care physician this week. Discussed follow up and return parameters including no resolution or any worsening concerns. Patient verbalized understanding and agreed to plan.   ____________________________________________   FINAL CLINICAL IMPRESSION(S) / ED DIAGNOSES  Final diagnoses:  Upper respiratory infection  Pharyngitis       Marylene Land, NP 07/21/15 1028

## 2015-07-21 NOTE — ED Notes (Signed)
Patient c/o cough / sore throat and congestion x couple days.

## 2015-07-23 ENCOUNTER — Telehealth: Payer: Self-pay | Admitting: Emergency Medicine

## 2015-07-23 ENCOUNTER — Telehealth: Payer: Self-pay | Admitting: Internal Medicine

## 2015-07-23 DIAGNOSIS — J020 Streptococcal pharyngitis: Secondary | ICD-10-CM

## 2015-07-23 LAB — CULTURE, GROUP A STREP (THRC)

## 2015-07-23 MED ORDER — PENICILLIN V POTASSIUM 500 MG PO TABS: 500.0000 mg | ORAL_TABLET | Freq: Two times a day (BID) | ORAL | Status: AC

## 2015-07-23 NOTE — ED Notes (Signed)
Patient was notified that his throat culture came back positive for strep.  Patient was notified that Dr. Valere Dross has sent a prescription for Penicillin to his pharmacy and that he came start taking the antibiotic.  Patient was also notified that if his symptoms do not improve or worsen to follow-up here or with his PCP.  Patient verbalized understanding.

## 2015-07-23 NOTE — ED Notes (Signed)
Throat culture with mod growth beta hemolytic organism; will send rx for penicillin to pharmacy of record and ask clinical staff to let patient know.  Sherlene Shams, MD 07/23/15 [phone]

## 2015-07-30 ENCOUNTER — Other Ambulatory Visit: Payer: Self-pay | Admitting: Family Medicine

## 2015-07-31 NOTE — Telephone Encounter (Signed)
Last sgpt and lipids reviewed; rx approved 

## 2015-07-31 NOTE — Telephone Encounter (Signed)
rx

## 2015-09-04 ENCOUNTER — Encounter: Payer: Self-pay | Admitting: Family Medicine

## 2015-09-04 ENCOUNTER — Ambulatory Visit (INDEPENDENT_AMBULATORY_CARE_PROVIDER_SITE_OTHER): Payer: BLUE CROSS/BLUE SHIELD | Admitting: Family Medicine

## 2015-09-04 VITALS — BP 102/71 | HR 81 | Temp 97.7°F | Ht 72.25 in | Wt 276.0 lb

## 2015-09-04 DIAGNOSIS — Z5181 Encounter for therapeutic drug level monitoring: Secondary | ICD-10-CM | POA: Diagnosis not present

## 2015-09-04 DIAGNOSIS — E1165 Type 2 diabetes mellitus with hyperglycemia: Secondary | ICD-10-CM

## 2015-09-04 DIAGNOSIS — E785 Hyperlipidemia, unspecified: Secondary | ICD-10-CM | POA: Diagnosis not present

## 2015-09-04 DIAGNOSIS — E669 Obesity, unspecified: Secondary | ICD-10-CM

## 2015-09-04 LAB — MICROALBUMIN, URINE WAIVED
CREATININE, URINE WAIVED: 100 mg/dL (ref 10–300)
MICROALB, UR WAIVED: 10 mg/L (ref 0–19)
Microalb/Creat Ratio: <30 mg/g (ref <30)

## 2015-09-04 NOTE — Assessment & Plan Note (Signed)
Keep up the good work with weight loss and trying to eat better; check lipids today; still on fish oil and statin

## 2015-09-04 NOTE — Progress Notes (Signed)
BP 102/71 mmHg  Pulse 81  Temp(Src) 97.7 F (36.5 C)  Ht 6' 0.25" (1.835 m)  Wt 276 lb (125.193 kg)  BMI 37.18 kg/m2  SpO2 97%  Subjective:    Patient ID: Donald Baxter, male    DOB: 08/21/1975, 40 y.o.   MRN: [id number]  HPI: Donald Baxter is a 40 y.o. male  Chief Complaint  Patient presents with  . Diabetes    routine follow up and labs. He has eye exam appointment next week.  . Hyperlipidemia    routine follow up and labs  . Obesity    routine follow up and labs   Diabetes type 2, diagnosed around 2008 He checks his sugars FSBS usually around 130, with some higher  Invokana is helping Last eye exam was around Dec 2015, has appt next week He would like to change the metformin; he takes 4 of the 500 mg ER; would like 1000 mg ER and just take two  High cholesterol; trying to eat better, losing weight; not a big egg eater; does like cheese, one of his problems; does drink milk whole milk; does not want to cut back to 2% when I asked  Both parents have high BP, but his pressure is tolerated  Obesity; lost 9 pounds since last check  Sensitive skin on the arms, dry skin on temples; no runny stools or abd pain with gluten  Relevant past medical, surgical, family and social history reviewed and updated as indicated. Interim medical history since our last visit reviewed. Allergies and medications reviewed and updated.  Review of Systems  Respiratory: Negative for shortness of breath.   Cardiovascular: Negative for chest pain.  Per HPI unless specifically indicated above     Objective:    BP 102/71 mmHg  Pulse 81  Temp(Src) 97.7 F (36.5 C)  Ht 6' 0.25" (1.835 m)  Wt 276 lb (125.193 kg)  BMI 37.18 kg/m2  SpO2 97%  Wt Readings from Last 3 Encounters:  09/04/15 276 lb (125.193 kg)  07/21/15 285 lb (129.275 kg)  06/04/15 284 lb (128.822 kg)    Physical Exam  Constitutional: He appears well-developed and well-nourished. No distress.  Weight down 9 pounds since  last check 6 weeks ago  HENT:  Head: Normocephalic and atraumatic.  Eyes: EOM are normal. No scleral icterus.  Neck: No thyromegaly present.  Cardiovascular: Normal rate and regular rhythm.   Pulmonary/Chest: Effort normal and breath sounds normal.  Abdominal: Soft. Bowel sounds are normal. He exhibits no distension.  Musculoskeletal: He exhibits no edema.  Neurological: Coordination normal.  Skin: Skin is warm and dry. No pallor.  Psychiatric: He has a normal mood and affect. His behavior is normal. Judgment and thought content normal.   Diabetic Foot Form - Detailed   Diabetic Foot Exam - detailed  Diabetic Foot exam was performed with the following findings:  Yes 09/04/2015 10:00 AM  Visual Foot Exam completed.:  Yes  Is there swelling or and abnormal foot shape?:  No  Are the toenails long?:  No  Are the toenails thick?:  No  Is there a claw toe deformity?:  No  Is there limited skin dorsiflexion?:  No  Is there foot or ankle muscle weakness?:  No  Are the toenails ingrown?:  No  Normal Range of Motion:  Yes    Pulse Foot Exam completed.:  Yes  Right Dorsalis Pedis:  Present Left Dorsalis Pedis:  Present  Sensory Foot Exam Completed.:  Yes  Swelling:  No  Semmes-Weinstein Monofilament Test  R Site 1-Great Toe:  Pos L Site 1-Great Toe:  Pos  R Site 4:  Pos L Site 4:  Pos  R Site 5:  Pos L Site 5:  Pos           Assessment & Plan:   Problem List Items Addressed This Visit      Endocrine   Diabetes mellitus with hyperglycemia ([facility]) - Primary    Last A1c showed improvement; check today; foot exam by MD, encouraged yearly eye exams; continue to work on weight loss, continue aspirin      Relevant Orders   Hgb A1c w/o eAG   Microalbumin, Urine Waived     Other   Obesity    So glad that patient is having some success with weight loss; limit saturated fats, continue medications, try to increase activity somewhat; see AVS      Dyslipidemia    Keep up the good work  with weight loss and trying to eat better; check lipids today; still on fish oil and statin      Relevant Orders   Lipid Panel w/o Chol/HDL Ratio   Medication monitoring encounter    Check labs on meds      Relevant Orders   Comprehensive metabolic panel      Follow up plan: Return in about 6 months (around 03/03/2016) for thirty minute follow-up with fasting labs.  Orders Placed This Encounter  Procedures  . Hgb A1c w/o eAG  . Lipid Panel w/o Chol/HDL Ratio  . Microalbumin, Urine Waived  . Comprehensive metabolic panel   An after-visit summary was printed and given to the patient at [place].  Please see the patient instructions which may contain other information and recommendations beyond what is mentioned above in the assessment and plan.

## 2015-09-04 NOTE — Patient Instructions (Addendum)
We'll contact you about your lab results Keep up the good work with your healthier eating efforts and weight loss You are welcome to only check your fingerstick blood sugars when you desire (if you feel symptomatic or if you're interested to know if a certain food might affect your blood sugar) Check your feet every night and let me know about any new numbness, sores, etc. If your A1c is under 7, we can see you back in 6 months If your A1c is 7 or higher, we'll see you back in 3 months Try to limit saturated fats in your diet (bologna, hot dogs, barbeque, cheeseburgers, hamburgers, steak, bacon, sausage, cheese, etc.) and get more fresh fruits, vegetables, and whole grains Diabetes and Exercise Exercising regularly is important. It is not just about losing weight. It has many health benefits, such as:  Improving your overall fitness, flexibility, and endurance.  Increasing your bone density.  Helping with weight control.  Decreasing your body fat.  Increasing your muscle strength.  Reducing stress and tension.  Improving your overall health. People with diabetes who exercise gain additional benefits because exercise:  Reduces appetite.  Improves the body's use of blood sugar (glucose).  Helps lower or control blood glucose.  Decreases blood pressure.  Helps control blood lipids (such as cholesterol and triglycerides).  Improves the body's use of the hormone insulin by:  Increasing the body's insulin sensitivity.  Reducing the body's insulin needs.  Decreases the risk for heart disease because exercising:  Lowers cholesterol and triglycerides levels.  Increases the levels of good cholesterol (such as high-density lipoproteins [HDL]) in the body.  Lowers blood glucose levels. YOUR ACTIVITY PLAN  Choose an activity that you enjoy, and set realistic goals. To exercise safely, you should begin practicing any new physical activity slowly, and gradually increase the  intensity of the exercise over time. Your health care provider or diabetes educator can help create an activity plan that works for you. General recommendations include:  Encouraging children to engage in at least 60 minutes of physical activity each day.  Stretching and performing strength training exercises, such as yoga or weight lifting, at least 2 times per week.  Performing a total of at least 150 minutes of moderate-intensity exercise each week, such as brisk walking or water aerobics.  Exercising at least 3 days per week, making sure you allow no more than 2 consecutive days to pass without exercising.  Avoiding long periods of inactivity (90 minutes or more). When you have to spend an extended period of time sitting down, take frequent breaks to walk or stretch. RECOMMENDATIONS FOR EXERCISING WITH TYPE 1 OR TYPE 2 DIABETES   Check your blood glucose before exercising. If blood glucose levels are greater than 240 mg/dL, check for urine ketones. Do not exercise if ketones are present.  Avoid injecting insulin into areas of the body that are going to be exercised. For example, avoid injecting insulin into:  The arms when playing tennis.  The legs when jogging.  Keep a record of:  Food intake before and after you exercise.  Expected peak times of insulin action.  Blood glucose levels before and after you exercise.  The type and amount of exercise you have done.  Review your records with your health care provider. Your health care provider will help you to develop guidelines for adjusting food intake and insulin amounts before and after exercising.  If you take insulin or oral hypoglycemic agents, watch for signs and symptoms of hypoglycemia.  They include:  Dizziness.  Shaking.  Sweating.  Chills.  Confusion.  Drink plenty of water while you exercise to prevent dehydration or heat stroke. Body water is lost during exercise and must be replaced.  Talk to your health  care provider before starting an exercise program to make sure it is safe for you. Remember, almost any type of activity is better than none.   This information is not intended to replace advice given to you by your health care provider. Make sure you discuss any questions you have with your health care provider.   Document Released: 10/24/2003 Document Revised: 12/18/2014 Document Reviewed: 01/10/2013 Elsevier Interactive Patient Education [employer].

## 2015-09-04 NOTE — Assessment & Plan Note (Addendum)
Last A1c showed improvement; check today; foot exam by MD, encouraged yearly eye exams; continue to work on weight loss, continue aspirin

## 2015-09-04 NOTE — Assessment & Plan Note (Signed)
So glad that patient is having some success with weight loss; limit saturated fats, continue medications, try to increase activity somewhat; see AVS

## 2015-09-04 NOTE — Assessment & Plan Note (Signed)
Check labs on meds 

## 2015-09-05 LAB — COMPREHENSIVE METABOLIC PANEL
ALBUMIN: 4.2 g/dL (ref 3.5–5.5)
ALK PHOS: 65 IU/L (ref 39–117)
ALT: 37 IU/L (ref 0–44)
AST: 26 IU/L (ref 0–40)
Albumin/Globulin Ratio: 1.7 (ref 1.1–2.5)
BILIRUBIN TOTAL: 0.5 mg/dL (ref 0.0–1.2)
BUN / CREAT RATIO: 13 (ref 9–20)
BUN: 14 mg/dL (ref 6–24)
CHLORIDE: 99 mmol/L (ref 96–106)
CO2: 22 mmol/L (ref 18–29)
CREATININE: 1.11 mg/dL (ref 0.76–1.27)
Calcium: 9.5 mg/dL (ref 8.7–10.2)
GFR calc Af Amer: 96 mL/min/{1.73_m2} (ref >59)
GFR calc non Af Amer: 83 mL/min/{1.73_m2} (ref >59)
GLUCOSE: 212 mg/dL — AB (ref 65–99)
Globulin, Total: 2.5 g/dL (ref 1.5–4.5)
Potassium: 4.4 mmol/L (ref 3.5–5.2)
Sodium: 137 mmol/L (ref 134–144)
Total Protein: 6.7 g/dL (ref 6.0–8.5)

## 2015-09-05 LAB — HGB A1C W/O EAG: Hgb A1c MFr Bld: 10.1 % — ABNORMAL HIGH (ref 4.8–5.6)

## 2015-09-05 LAB — LIPID PANEL W/O CHOL/HDL RATIO
Cholesterol, Total: 202 mg/dL — ABNORMAL HIGH (ref 100–199)
HDL: 34 mg/dL — AB (ref >39)
LDL CALC: 98 mg/dL (ref 0–99)
TRIGLYCERIDES: 352 mg/dL — AB (ref 0–149)
VLDL Cholesterol Cal: 70 mg/dL — ABNORMAL HIGH (ref 5–40)

## 2015-09-10 ENCOUNTER — Telehealth: Payer: Self-pay | Admitting: Family Medicine

## 2015-09-10 NOTE — Telephone Encounter (Signed)
Call patient Wednesday about labs; A1c has gone up; lipids have worsened

## 2015-09-11 MED ORDER — SITAGLIPTIN PHOSPHATE 100 MG PO TABS: 100.0000 mg | ORAL_TABLET | Freq: Every day | ORAL | Status: DC

## 2015-09-11 NOTE — Telephone Encounter (Signed)
I talked wiht patient; A1c has gone up; we'll third medicine; if any coverage issues, have pharmacist call me TG have gone up, likely reflection of high A1c; he says he is taking his medicines Work on weight loss, healthy eating Liver and kidney function look fine Urine microalbumin/creatinine ratio good

## 2015-09-13 ENCOUNTER — Encounter: Payer: Self-pay | Admitting: Family Medicine

## 2015-09-18 ENCOUNTER — Telehealth: Payer: Self-pay | Admitting: Family Medicine

## 2015-09-18 MED ORDER — SAXAGLIPTIN HCL 5 MG PO TABS: 5.0000 mg | ORAL_TABLET | Freq: Every day | ORAL | Status: DC

## 2015-09-18 NOTE — Telephone Encounter (Signed)
Routing to provider  

## 2015-09-18 NOTE — Telephone Encounter (Signed)
Pt called stated the medication Dr. Sanda Klein prescribed was too expensive and that he would like something else sent to his pharmacy. I asked the pt if he knew what medication the insurance company would approve, he stated Dr. Sanda Klein said she would figure out another medication and send it in. I explained to the pt that it would help process the request quicker if he contacted his insurance and got an alternative that is covered by his insurance. Pt asked that we have Dr. Sanda Klein contact him.

## 2015-09-18 NOTE — Telephone Encounter (Signed)
I spoke with pharmacist There is a savings coupon for Onglyza pharmacist says Please get patient one of those cards or have him pull it up on line

## 2015-09-19 NOTE — Telephone Encounter (Signed)
Patient notified

## 2015-09-27 LAB — HM DIABETES EYE EXAM

## 2015-12-04 ENCOUNTER — Other Ambulatory Visit: Payer: Self-pay | Admitting: Family Medicine

## 2016-02-27 ENCOUNTER — Other Ambulatory Visit: Payer: Self-pay | Admitting: Family Medicine

## 2016-02-28 NOTE — Telephone Encounter (Signed)
Pt has appt next week.

## 2016-03-04 ENCOUNTER — Ambulatory Visit (INDEPENDENT_AMBULATORY_CARE_PROVIDER_SITE_OTHER): Payer: BLUE CROSS/BLUE SHIELD | Admitting: Family Medicine

## 2016-03-24 ENCOUNTER — Ambulatory Visit (INDEPENDENT_AMBULATORY_CARE_PROVIDER_SITE_OTHER): Payer: BLUE CROSS/BLUE SHIELD | Admitting: Family Medicine

## 2016-03-24 ENCOUNTER — Encounter: Payer: Self-pay | Admitting: Family Medicine

## 2016-03-24 VITALS — BP 122/64 | HR 99 | Temp 97.4°F | Resp 14 | Wt 271.0 lb

## 2016-03-24 DIAGNOSIS — E785 Hyperlipidemia, unspecified: Secondary | ICD-10-CM

## 2016-03-24 DIAGNOSIS — E669 Obesity, unspecified: Secondary | ICD-10-CM | POA: Diagnosis not present

## 2016-03-24 DIAGNOSIS — J3089 Other allergic rhinitis: Secondary | ICD-10-CM

## 2016-03-24 DIAGNOSIS — Z5181 Encounter for therapeutic drug level monitoring: Secondary | ICD-10-CM | POA: Diagnosis not present

## 2016-03-24 DIAGNOSIS — E1165 Type 2 diabetes mellitus with hyperglycemia: Secondary | ICD-10-CM

## 2016-03-24 LAB — LIPID PANEL
CHOL/HDL RATIO: 5.7 ratio — AB (ref <=5.0)
CHOLESTEROL: 195 mg/dL (ref 125–200)
HDL: 34 mg/dL — AB (ref >=40)
Triglycerides: 487 mg/dL — ABNORMAL HIGH (ref <150)

## 2016-03-24 LAB — COMPLETE METABOLIC PANEL WITH GFR
ALBUMIN: 4.2 g/dL (ref 3.6–5.1)
ALK PHOS: 55 U/L (ref 40–115)
ALT: 24 U/L (ref 9–46)
AST: 21 U/L (ref 10–40)
BILIRUBIN TOTAL: 0.6 mg/dL (ref 0.2–1.2)
BUN: 12 mg/dL (ref 7–25)
CALCIUM: 9.2 mg/dL (ref 8.6–10.3)
CO2: 24 mmol/L (ref 20–31)
Chloride: 103 mmol/L (ref 98–110)
Creat: 1.1 mg/dL (ref 0.60–1.35)
GFR, EST NON AFRICAN AMERICAN: 83 mL/min (ref >=60)
Glucose, Bld: 164 mg/dL — ABNORMAL HIGH (ref 65–99)
POTASSIUM: 4.4 mmol/L (ref 3.5–5.3)
Sodium: 138 mmol/L (ref 135–146)
TOTAL PROTEIN: 6.6 g/dL (ref 6.1–8.1)

## 2016-03-24 NOTE — Patient Instructions (Signed)
  Try to limit saturated fats in your diet (bologna, hot dogs, barbeque, cheeseburgers, hamburgers, steak, bacon, sausage, cheese, etc.) and get more fresh fruits, vegetables, and whole grains  Please do see your eye doctor regularly, and have your eyes examined every year (or more often per his or her recommendation) Check your feet every night and let me know right away of any sores, infections, numbness, etc. Try to limit sweets, white bread, white rice, white potatoes It is okay with me for you to not check your fingerstick blood sugars (per [employer] of [employer]in[employer] Best Practices), unless you are interested and feel it would be helpful for you  Check out the information at familydoctor.org entitled "Nutrition for Weight Loss: What You Need to Know about Fad Diets" Try to lose between 1-2 pounds per week by taking in fewer calories and burning off more calories You can succeed by limiting portions, limiting foods dense in calories and fat, becoming more active, and drinking 8 glasses of water a day (64 ounces) Don't skip meals, especially breakfast, as skipping meals may alter your metabolism Do not use over-the-counter weight loss pills or gimmicks that claim rapid weight loss A healthy BMI (or body mass index) is between 18.5 and 24.9 You can calculate your ideal BMI at the [place] website [employer]Mon[employer]fr  Let's get labs I've referred you to the allergist If you have not heard anything from my staff in a week about any orders/referrals/studies from today, please contact us here to follow-up [phone]

## 2016-03-24 NOTE — Assessment & Plan Note (Signed)
Check sgpt and Cr 

## 2016-03-24 NOTE — Progress Notes (Signed)
BP 122/64   Pulse 99   Temp 97.4 F (36.3 C) (Oral)   Resp 14   Wt 271 lb (122.9 kg)   SpO2 98%   BMI 36.50 kg/m    Subjective:    Patient ID: Donald Baxter, male    DOB: [birth year], 40 y.o.   MRN: [id number]  HPI: Donald Baxter is a 40 y.o. male  Chief Complaint  Patient presents with  . Follow-up    Type 2 diabetes; checking FSBS just occasionally with my blessing; highest over last few months was 175; lowest was 120; always has dry mouth; vision not that good, saw eye doctor in January, new glasses but he's not happy with them; no problems with feet, no numbness or sores; does not drink Coke products, likes diet Coke, but will have sweet tea if going out to eat, does have some energy drink; on metformin and januvia and invokana  High cholesterol; taking simvastatin; no muscle aches or joint aches; not many eggs; maybe 2 eggs a week; eats some fatty meats  Obesity; he has lost 14 pounds in 8 months; trying to eat less  He is bothered with allergies and would like to look into allergy shots  Depression screen PHQ 2/9 03/24/2016  Decreased Interest 0  Down, Depressed, Hopeless 0  PHQ - 2 Score 0   Relevant past medical, surgical, family and social history reviewed Past Medical History:  Diagnosis Date  . Bulge of cervical disc without myelopathy    between c3-c4  . Diabetes mellitus without complication ([place])   . Folliculitis   . Hyperlipidemia   . Low testosterone   . Obesity   . Psoriasis   . Tarsal tunnel syndrome    Past Surgical History:  Procedure Laterality Date  . ANKLE SURGERY Bilateral    tarsal tunnel    Family History  Problem Relation Age of Onset  . Diabetes Mother   . Hypertension Mother   . Hyperlipidemia Mother   . Diabetes Father   . Hypertension Father   . Hyperlipidemia Father   . Cancer Maternal Grandmother   . Stroke Maternal Grandmother   . Cancer Paternal Grandmother     lung?  . Stroke Paternal Grandmother    Social History    Substance Use Topics  . Smoking status: Never Smoker  . Smokeless tobacco: Never Used  . Alcohol use Yes     Comment: occasional   Interim medical history since last visit reviewed. Allergies and medications reviewed  Review of Systems Per HPI unless specifically indicated above     Objective:    BP 122/64   Pulse 99   Temp 97.4 F (36.3 C) (Oral)   Resp 14   Wt 271 lb (122.9 kg)   SpO2 98%   BMI 36.50 kg/m   Wt Readings from Last 3 Encounters:  03/24/16 271 lb (122.9 kg)  09/04/15 276 lb (125.2 kg)  07/21/15 285 lb (129.3 kg)    Physical Exam  Constitutional: He appears well-developed and well-nourished. No distress.  Obese, but weight down another 5 pounds since last visit in January, and a total of 14 pounds down over last 8 months  HENT:  Head: Normocephalic and atraumatic.  Eyes: EOM are normal. No scleral icterus.  Neck: No thyromegaly present.  Cardiovascular: Normal rate and regular rhythm.   Pulmonary/Chest: Effort normal and breath sounds normal.  Abdominal: Soft. Bowel sounds are normal. He exhibits no distension.  Musculoskeletal: He exhibits no edema.  Neurological: Coordination normal.  Skin: Skin is warm and dry. No pallor.  Psychiatric: He has a normal mood and affect. His behavior is normal. Judgment and thought content normal.   Diabetic Foot Form - Detailed   Diabetic Foot Exam - detailed Diabetic Foot exam was performed with the following findings:  Yes 03/24/2016  6:10 AM  Visual Foot Exam completed.:  Yes  Are the toenails long?:  No Are the toenails thick?:  No Are the toenails ingrown?:  No Normal Range of Motion:  Yes Pulse Foot Exam completed.:  Yes  Right Dorsalis Pedis:  Present Left Dorsalis Pedis:  Present  Sensory Foot Exam Completed.:  Yes Swelling:  No Semmes-Weinstein Monofilament Test R Site 1-Great Toe:  Pos L Site 1-Great Toe:  Pos  R Site 4:  Pos L Site 4:  Pos  R Site 5:  Pos L Site 5:  Pos          Assessment &  Plan:   Problem List Items Addressed This Visit      Respiratory   Allergic rhinitis    Patient interested in allergy shots; will refer to ENT      Relevant Orders   Ambulatory referral to ENT     Endocrine   Diabetes mellitus with hyperglycemia ([place]) - Primary    Foot exam by MD; avoid whites; check A1c; last urine microalbumin:Cr was normal      Relevant Medications   JANUVIA 100 MG tablet   Other Relevant Orders   HgB A1c (Completed)     Other   Obesity    Praise given for weight loss; keep it up      Relevant Medications   JANUVIA 100 MG tablet   Other Relevant Orders   COMPLETE METABOLIC PANEL WITH GFR (Completed)   Medication monitoring encounter    Check sgpt and Cr      Relevant Orders   HgB A1c (Completed)   COMPLETE METABOLIC PANEL WITH GFR (Completed)   Lipid Profile (Completed)   Dyslipidemia    Limit saturated fats, continue statin; check lipids today      Relevant Orders   Lipid Profile (Completed)    Other Visit Diagnoses   None.      Follow up plan: Return in about 6 months (around 09/24/2016) for follow-up if A1c <7; back in 3 months if A1c is 7 or higher.  An after-visit summary was printed and given to the patient at [facility].  Please see the patient instructions which may contain other information and recommendations beyond what is mentioned above in the assessment and plan.  Meds ordered this encounter  Medications  . JANUVIA 100 MG tablet    Sig: 100 mg daily.     Orders Placed This Encounter  Procedures  . HgB A1c  . COMPLETE METABOLIC PANEL WITH GFR  . Lipid Profile  . Ambulatory referral to ENT

## 2016-03-24 NOTE — Assessment & Plan Note (Signed)
Limit saturated fats, continue statin; check lipids today

## 2016-03-24 NOTE — Assessment & Plan Note (Signed)
Praise given for weight loss; keep it up

## 2016-03-24 NOTE — Assessment & Plan Note (Signed)
Patient interested in allergy shots; will refer to ENT

## 2016-03-24 NOTE — Assessment & Plan Note (Signed)
Foot exam by MD; avoid whites; check A1c; last urine microalbumin:Cr was normal

## 2016-03-25 ENCOUNTER — Other Ambulatory Visit: Payer: Self-pay | Admitting: Family Medicine

## 2016-03-25 ENCOUNTER — Encounter: Payer: Self-pay | Admitting: Family Medicine

## 2016-03-25 LAB — HEMOGLOBIN A1C
Hgb A1c MFr Bld: 8.5 % — ABNORMAL HIGH (ref <5.7)
Mean Plasma Glucose: 197 mg/dL

## 2016-03-25 MED ORDER — ICOSAPENT ETHYL 1 G PO CAPS: 2.0000 | ORAL_CAPSULE | Freq: Two times a day (BID) | ORAL | 5 refills | Status: DC

## 2016-03-25 NOTE — Progress Notes (Signed)
rx printed

## 2016-03-27 MED ORDER — GLIPIZIDE ER 5 MG PO TB24: 5.0000 mg | ORAL_TABLET | Freq: Every day | ORAL | 2 refills | Status: DC

## 2016-03-31 ENCOUNTER — Other Ambulatory Visit: Payer: Self-pay | Admitting: Family Medicine

## 2016-03-31 NOTE — Telephone Encounter (Signed)
Last CMP reviewed; Rxs approved

## 2016-04-12 ENCOUNTER — Encounter: Payer: Self-pay | Admitting: Family Medicine

## 2016-04-21 DIAGNOSIS — J309 Allergic rhinitis, unspecified: Secondary | ICD-10-CM | POA: Diagnosis not present

## 2016-04-21 DIAGNOSIS — J342 Deviated nasal septum: Secondary | ICD-10-CM | POA: Diagnosis not present

## 2016-05-28 ENCOUNTER — Encounter: Payer: Self-pay | Admitting: Family Medicine

## 2016-05-28 ENCOUNTER — Telehealth: Payer: Self-pay

## 2016-05-28 MED ORDER — SIMVASTATIN 40 MG PO TABS: 40.0000 mg | ORAL_TABLET | Freq: Every day | ORAL | 2 refills | Status: DC

## 2016-05-28 NOTE — Telephone Encounter (Signed)
Patient is due for an appointment on or after November 8th please His last A1c showed uncontrolled diabetes, so we need to see him every 3 months until his A1c is under 7 I'll approve Rx as requested; thank you

## 2016-05-29 MED ORDER — SIMVASTATIN 40 MG PO TABS: 40.0000 mg | ORAL_TABLET | Freq: Every day | ORAL | 2 refills | Status: DC

## 2016-05-29 MED ORDER — JANUVIA 100 MG PO TABS: 100.0000 mg | ORAL_TABLET | Freq: Every day | ORAL | 2 refills | Status: DC

## 2016-05-29 MED ORDER — ICOSAPENT ETHYL 1 G PO CAPS: 2.0000 | ORAL_CAPSULE | Freq: Two times a day (BID) | ORAL | 5 refills | Status: DC

## 2016-05-29 NOTE — Telephone Encounter (Signed)
LMOM for pt to call back to schedule an appt

## 2016-09-15 DIAGNOSIS — E119 Type 2 diabetes mellitus without complications: Secondary | ICD-10-CM | POA: Diagnosis not present

## 2016-09-15 DIAGNOSIS — E089 Diabetes mellitus due to underlying condition without complications: Secondary | ICD-10-CM | POA: Diagnosis not present

## 2016-09-15 DIAGNOSIS — H16143 Punctate keratitis, bilateral: Secondary | ICD-10-CM | POA: Diagnosis not present

## 2016-09-29 ENCOUNTER — Ambulatory Visit (INDEPENDENT_AMBULATORY_CARE_PROVIDER_SITE_OTHER): Payer: BLUE CROSS/BLUE SHIELD | Admitting: Family Medicine

## 2016-09-29 ENCOUNTER — Encounter: Payer: Self-pay | Admitting: Family Medicine

## 2016-09-29 DIAGNOSIS — Z6837 Body mass index (BMI) 37.0-37.9, adult: Secondary | ICD-10-CM | POA: Diagnosis not present

## 2016-09-29 DIAGNOSIS — Z5181 Encounter for therapeutic drug level monitoring: Secondary | ICD-10-CM

## 2016-09-29 DIAGNOSIS — E1165 Type 2 diabetes mellitus with hyperglycemia: Secondary | ICD-10-CM | POA: Diagnosis not present

## 2016-09-29 DIAGNOSIS — E6609 Other obesity due to excess calories: Secondary | ICD-10-CM | POA: Diagnosis not present

## 2016-09-29 DIAGNOSIS — E785 Hyperlipidemia, unspecified: Secondary | ICD-10-CM | POA: Diagnosis not present

## 2016-09-29 DIAGNOSIS — IMO0001 Reserved for inherently not codable concepts without codable children: Secondary | ICD-10-CM

## 2016-09-29 LAB — COMPREHENSIVE METABOLIC PANEL
ALK PHOS: 58 U/L (ref 40–115)
ALT: 20 U/L (ref 9–46)
AST: 18 U/L (ref 10–40)
Albumin: 4.1 g/dL (ref 3.6–5.1)
BUN: 9 mg/dL (ref 7–25)
CALCIUM: 9.3 mg/dL (ref 8.6–10.3)
CHLORIDE: 106 mmol/L (ref 98–110)
CO2: 24 mmol/L (ref 20–31)
Creat: 1.18 mg/dL (ref 0.60–1.35)
GLUCOSE: 155 mg/dL — AB (ref 65–99)
POTASSIUM: 4.4 mmol/L (ref 3.5–5.3)
Sodium: 141 mmol/L (ref 135–146)
Total Bilirubin: 0.5 mg/dL (ref 0.2–1.2)
Total Protein: 6.6 g/dL (ref 6.1–8.1)

## 2016-09-29 LAB — LIPID PANEL
CHOL/HDL RATIO: 4.8 ratio (ref <5.0)
CHOLESTEROL: 152 mg/dL (ref <200)
HDL: 32 mg/dL — AB (ref >40)
LDL CALC: 72 mg/dL (ref <100)
TRIGLYCERIDES: 241 mg/dL — AB (ref <150)
VLDL: 48 mg/dL — AB (ref <30)

## 2016-09-29 NOTE — Assessment & Plan Note (Signed)
Check labs 

## 2016-09-29 NOTE — Assessment & Plan Note (Signed)
Foot exam by MD today; offered referral to podiatrist and he will consider and call me back; check A1c today

## 2016-09-29 NOTE — Assessment & Plan Note (Signed)
Check lipids today; continue statin and vascepa; limit starches

## 2016-09-29 NOTE — Progress Notes (Signed)
BP 124/68   Pulse 96   Temp 98.1 F (36.7 C) (Oral)   Resp 16   Wt 280 lb 9 oz (127.3 kg)   SpO2 96%   BMI 37.79 kg/m    Subjective:    Patient ID: Donald Baxter, male    DOB: 1975/08/28, 41 y.o.   MRN: [id number]  HPI: Donald Baxter is a 41 y.o. male  Chief Complaint  Patient presents with  . Diabetes  . Hyperlipidemia  . Obesity   Type 2 diabetes; some pain over the left arch of the foot dorsum; no sores; saw eye doctor at the start of the month; he does not want to do any injections for diabetes; he "hates" needles  High cholesterol; runs in the family; trying to limit things; cutting back on eggs  Obesity; nothing I can do he says; truncal obesity; wife struggles with obesity too Moving bowels okay and energy level is okay Lab Results  Component Value Date   TSH 2.940 06/04/2015   Depression screen [facility] 2/9 09/29/2016 03/24/2016  Decreased Interest 0 0  Down, Depressed, Hopeless 0 0  PHQ - 2 Score 0 0   Relevant past medical, surgical, family and social history reviewed Past Medical History:  Diagnosis Date  . Bulge of cervical disc without myelopathy    between c3-c4  . Diabetes mellitus without complication ([place])   . Folliculitis   . Hyperlipidemia   . Low testosterone   . Obesity   . Psoriasis   . Tarsal tunnel syndrome    Past Surgical History:  Procedure Laterality Date  . ANKLE SURGERY Bilateral    tarsal tunnel    Family History  Problem Relation Age of Onset  . Hypertension Mother   . Hyperlipidemia Mother   . Diabetes Father   . Hypertension Father   . Hyperlipidemia Father   . Cancer Maternal Grandmother     not sure what kind  . Cancer Paternal Grandmother     lung?  . Stroke Paternal Grandmother   . Heart disease Paternal Grandfather     heart failure   Social History  Substance Use Topics  . Smoking status: Never Smoker  . Smokeless tobacco: Never Used  . Alcohol use Yes     Comment: occasional   Interim medical history  since last visit reviewed. Allergies and medications reviewed  Review of Systems Per HPI unless specifically indicated above     Objective:    BP 124/68   Pulse 96   Temp 98.1 F (36.7 C) (Oral)   Resp 16   Wt 280 lb 9 oz (127.3 kg)   SpO2 96%   BMI 37.79 kg/m   Wt Readings from Last 3 Encounters:  09/29/16 280 lb 9 oz (127.3 kg)  03/24/16 271 lb (122.9 kg)  09/04/15 276 lb (125.2 kg)    Physical Exam  Constitutional: He appears well-developed and well-nourished. No distress.  Obese, weight gain of 9 pounds 9 ounces since last visit  HENT:  Head: Normocephalic and atraumatic.  Eyes: EOM are normal. No scleral icterus.  Neck: No thyromegaly present.  Cardiovascular: Normal rate and regular rhythm.   Pulmonary/Chest: Effort normal and breath sounds normal.  Abdominal: Soft. Bowel sounds are normal. He exhibits no distension.  Musculoskeletal: He exhibits no edema.  Neurological: Coordination normal.  Skin: Skin is warm and dry. No pallor.  Psychiatric: He has a normal mood and affect. His behavior is normal. Judgment and thought content normal.  Diabetic Foot Form - Detailed   Diabetic Foot Exam - detailed Diabetic Foot exam was performed with the following findings:  Yes 09/29/2016  9:19 AM  Visual Foot Exam completed.:  Yes  Are the toenails ingrown?:  No Normal Range of Motion:  Yes Pulse Foot Exam completed.:  Yes  Right Dorsalis Pedis:  Present Left Dorsalis Pedis:  Present  Sensory Foot Exam Completed.:  Yes Semmes-Weinstein Monofilament Test R Site 1-Great Toe:  Pos L Site 1-Great Toe:  Pos  R Site 4:  Pos L Site 4:  Pos  R Site 5:  Pos L Site 5:  Pos    Comments:  Some mild tenderness over the dorsum of the LEFT foot; no overlying skin changes; no erythema; no bony abnormalities; pes planus bilaterally     Results for orders placed or performed in visit on 03/24/16  HgB A1c  Result Value Ref Range   Hgb A1c MFr Bld 8.5 (H) <5.7 %   Mean Plasma Glucose  197 mg/dL  COMPLETE METABOLIC PANEL WITH GFR  Result Value Ref Range   Sodium 138 135 - 146 mmol/L   Potassium 4.4 3.5 - 5.3 mmol/L   Chloride 103 98 - 110 mmol/L   CO2 24 20 - 31 mmol/L   Glucose, Bld 164 (H) 65 - 99 mg/dL   BUN 12 7 - 25 mg/dL   Creat 1.10 0.60 - 1.35 mg/dL   Total Bilirubin 0.6 0.2 - 1.2 mg/dL   Alkaline Phosphatase 55 40 - 115 U/L   AST 21 10 - 40 U/L   ALT 24 9 - 46 U/L   Total Protein 6.6 6.1 - 8.1 g/dL   Albumin 4.2 3.6 - 5.1 g/dL   Calcium 9.2 8.6 - 10.3 mg/dL   GFR, Est African American >89 >=60 mL/min   GFR, Est Non African American 83 >=60 mL/min  Lipid Profile  Result Value Ref Range   Cholesterol 195 125 - 200 mg/dL   Triglycerides 487 (H) <150 mg/dL   HDL 34 (L) >=40 mg/dL   Total CHOL/HDL Ratio 5.7 (H) <=5.0 Ratio   VLDL NOT CALC <30 mg/dL   LDL Cholesterol NOT CALC <130 mg/dL      Assessment & Plan:   Problem List Items Addressed This Visit      Endocrine   Diabetes mellitus with hyperglycemia ([name])    Foot exam by MD today; offered referral to podiatrist and he will consider and call me back; check A1c today      Relevant Orders   Lipid panel   Hemoglobin A1c   Microalbumin / creatinine urine ratio     Other   Obesity    Keep working on weight loss, discussed obesity as disease      Medication monitoring encounter    Check labs      Relevant Orders   Comprehensive metabolic panel   Dyslipidemia    Check lipids today; continue statin and vascepa; limit starches      Relevant Orders   Lipid panel       Follow up plan: Return in about 3 months (around 12/27/2016) for follow-up and fasting labs.  An after-visit summary was printed and given to the patient at [facility].  Please see the patient instructions which may contain other information and recommendations beyond what is mentioned above in the assessment and plan.  No orders of the defined types were placed in this encounter.   Orders Placed This Encounter    Procedures  .  Lipid panel  . Comprehensive metabolic panel  . Hemoglobin A1c  . Microalbumin / creatinine urine ratio

## 2016-09-29 NOTE — Patient Instructions (Signed)
Try to limit saturated fats in your diet (bologna, hot dogs, barbeque, cheeseburgers, hamburgers, steak, bacon, sausage, cheese, etc.) and get more fresh fruits, vegetables, and whole grains Please do see your eye doctor regularly, and have your eyes examined every year (or more often per his or her recommendation) Check your feet every night and let me know right away of any sores, infections, numbness, etc. Try to limit sweets, white bread, white rice, white potatoes It is okay with me for you to not check your fingerstick blood sugars (per [employer]in[employer] Best Practices), unless you are interested and feel it would be helpful for you Check out the information at familydoctor.org entitled "Nutrition for Weight Loss: What You Need to Know about Fad Diets" Try to lose between 1-2 pounds per week by taking in fewer calories and burning off more calories You can succeed by limiting portions, limiting foods dense in calories and fat, becoming more active, and drinking 8 glasses of water a day (64 ounces) Don't skip meals, especially breakfast, as skipping meals may alter your metabolism Do not use over-the-counter weight loss pills or gimmicks that claim rapid weight loss A healthy BMI (or body mass index) is between 18.5 and 24.9 You can calculate your ideal BMI at the [place] website [employer]fr Obesity is a disease, and I am very willing to refer you to an obesity specialist if you desire We'll see you back in 3 months if your A1c is 7 or higher; we'll see you back in 6 months if your A1c is less than 7

## 2016-09-29 NOTE — Assessment & Plan Note (Signed)
Keep working on weight loss, discussed obesity as disease

## 2016-09-30 LAB — MICROALBUMIN / CREATININE URINE RATIO
CREATININE, URINE: 121 mg/dL (ref 20–370)
MICROALB UR: 0.5 mg/dL
MICROALB/CREAT RATIO: 4 ug/mg{creat} (ref <30)

## 2016-09-30 LAB — HEMOGLOBIN A1C
Hgb A1c MFr Bld: 8.5 % — ABNORMAL HIGH (ref <5.7)
Mean Plasma Glucose: 197 mg/dL

## 2016-10-04 ENCOUNTER — Other Ambulatory Visit: Payer: Self-pay | Admitting: Family Medicine

## 2016-10-04 MED ORDER — GLIPIZIDE ER 10 MG PO TB24: 10.0000 mg | ORAL_TABLET | Freq: Every day | ORAL | 5 refills | Status: DC

## 2016-10-04 NOTE — Progress Notes (Signed)
Increase sulfonylurea

## 2016-10-07 DIAGNOSIS — H04123 Dry eye syndrome of bilateral lacrimal glands: Secondary | ICD-10-CM | POA: Diagnosis not present

## 2016-10-07 DIAGNOSIS — H16143 Punctate keratitis, bilateral: Secondary | ICD-10-CM | POA: Diagnosis not present

## 2016-10-26 ENCOUNTER — Other Ambulatory Visit: Payer: Self-pay | Admitting: Family Medicine

## 2016-10-27 NOTE — Telephone Encounter (Signed)
Labs reviewed Refills approved through October

## 2016-11-03 DIAGNOSIS — H16121 Filamentary keratitis, right eye: Secondary | ICD-10-CM | POA: Diagnosis not present

## 2016-11-03 DIAGNOSIS — H04123 Dry eye syndrome of bilateral lacrimal glands: Secondary | ICD-10-CM | POA: Diagnosis not present

## 2016-12-29 ENCOUNTER — Encounter: Payer: Self-pay | Admitting: Family Medicine

## 2016-12-29 ENCOUNTER — Ambulatory Visit (INDEPENDENT_AMBULATORY_CARE_PROVIDER_SITE_OTHER): Payer: BLUE CROSS/BLUE SHIELD | Admitting: Family Medicine

## 2016-12-29 VITALS — BP 122/64 | HR 95 | Temp 98.0°F | Resp 14 | Wt 278.2 lb

## 2016-12-29 DIAGNOSIS — J3089 Other allergic rhinitis: Secondary | ICD-10-CM | POA: Diagnosis not present

## 2016-12-29 DIAGNOSIS — H04123 Dry eye syndrome of bilateral lacrimal glands: Secondary | ICD-10-CM | POA: Diagnosis not present

## 2016-12-29 DIAGNOSIS — Z6837 Body mass index (BMI) 37.0-37.9, adult: Secondary | ICD-10-CM

## 2016-12-29 DIAGNOSIS — E1165 Type 2 diabetes mellitus with hyperglycemia: Secondary | ICD-10-CM

## 2016-12-29 DIAGNOSIS — E785 Hyperlipidemia, unspecified: Secondary | ICD-10-CM

## 2016-12-29 DIAGNOSIS — Z5181 Encounter for therapeutic drug level monitoring: Secondary | ICD-10-CM | POA: Diagnosis not present

## 2016-12-29 LAB — LIPID PANEL
CHOLESTEROL: 176 mg/dL (ref <200)
HDL: 31 mg/dL — ABNORMAL LOW (ref >40)
LDL CALC: 78 mg/dL (ref <100)
TRIGLYCERIDES: 334 mg/dL — AB (ref <150)
Total CHOL/HDL Ratio: 5.7 Ratio — ABNORMAL HIGH (ref <5.0)
VLDL: 67 mg/dL — AB (ref <30)

## 2016-12-29 MED ORDER — MONTELUKAST SODIUM 10 MG PO TABS: 10.0000 mg | ORAL_TABLET | Freq: Every day | ORAL | 12 refills | Status: DC

## 2016-12-29 MED ORDER — OMEGA-3-ACID ETHYL ESTERS 1 G PO CAPS: 2.0000 g | ORAL_CAPSULE | Freq: Two times a day (BID) | ORAL | 12 refills | Status: DC

## 2016-12-29 NOTE — Assessment & Plan Note (Signed)
Check a1c today; encouragement given for weight loss

## 2016-12-29 NOTE — Assessment & Plan Note (Signed)
Switch vascepa to lovaza because of cost

## 2016-12-29 NOTE — Patient Instructions (Addendum)
Add singulair (montelukast) to help decrease allergies I hope you'll be able to use the allegra less often Stop vascepa and start Lovaza Please do see your eye doctor regularly, and have your eyes examined every year (or more often per his or her recommendation) Check your feet every night and let me know right away of any sores, infections, numbness, etc. Try to limit sweets, white bread, white rice, white potatoes It is okay with me for you to not check your fingerstick blood sugars (per [employer] of [employer] Best Practices), unless you are interested and feel it would be helpful for you Try to limit saturated fats in your diet (bologna, hot dogs, barbeque, cheeseburgers, hamburgers, steak, bacon, sausage, cheese, etc.) and get more fresh fruits, vegetables, and whole grains

## 2016-12-29 NOTE — Assessment & Plan Note (Signed)
Reviewed last creatinine, K+, and SGPT; normal 3 months ago so no need to check today

## 2016-12-29 NOTE — Assessment & Plan Note (Signed)
Continue to work on weight loss 

## 2016-12-29 NOTE — Progress Notes (Signed)
BP 122/64   Pulse 95   Temp 98 F (36.7 C) (Oral)   Resp 14   Wt 278 lb 3.2 oz (126.2 kg)   SpO2 97%   BMI 37.47 kg/m    Subjective:    Patient ID: Donald Baxter, male    DOB: 1976-04-22, 41 y.o.   MRN: [id number]  HPI: Donald Baxter is a 41 y.o. male  Chief Complaint  Patient presents with  . Follow-up    3 months   HPI  Type 2 diabetes; no chest pain, no SHOB; no dry mouth, no blurred vision; super dry eyes; has to see a specialist; father is having dry eye problem too; on antihistamine for really bad allergies; on Invokana  High cholesterol; trying to eat better; hardly eats any eggs; loves cheese; doesn't do much fast food; vascepa is expensive  Sat down too quickly on a rock and nub got him; thinks he hurt his tailbone; he has no desire for xrays; can control B/B; can bend forward and no weakness in legs  Obesity; this has been a struggle; runs in the family  Depression screen [facility] 2/9 12/29/2016 09/29/2016 03/24/2016  Decreased Interest 0 0 0  Down, Depressed, Hopeless 0 0 0  PHQ - 2 Score 0 0 0    Relevant past medical, surgical, family and social history reviewed Past Medical History:  Diagnosis Date  . Bulge of cervical disc without myelopathy    between c3-c4  . Diabetes mellitus without complication ([place])   . Folliculitis   . Hyperlipidemia   . Low testosterone   . Obesity   . Psoriasis   . Tarsal tunnel syndrome    Past Surgical History:  Procedure Laterality Date  . ANKLE SURGERY Bilateral    tarsal tunnel    Family History  Problem Relation Age of Onset  . Hypertension Mother   . Hyperlipidemia Mother   . Diabetes Father   . Hypertension Father   . Hyperlipidemia Father   . Cancer Maternal Grandmother        not sure what kind  . Cancer Paternal Grandmother        lung?  . Stroke Paternal Grandmother   . Heart disease Paternal Grandfather        heart failure   Social History   Social History  . Marital status: Married    Spouse  name: N/A  . Number of children: N/A  . Years of education: N/A   Occupational History  . Not on file.   Social History Main Topics  . Smoking status: Never Smoker  . Smokeless tobacco: Never Used  . Alcohol use Yes     Comment: occasional  . Drug use: No  . Sexual activity: Not on file   Other Topics Concern  . Not on file   Social History Narrative  . No narrative on file    Interim medical history since last visit reviewed. Allergies and medications reviewed  Review of Systems Per HPI unless specifically indicated above     Objective:    BP 122/64   Pulse 95   Temp 98 F (36.7 C) (Oral)   Resp 14   Wt 278 lb 3.2 oz (126.2 kg)   SpO2 97%   BMI 37.47 kg/m   Wt Readings from Last 3 Encounters:  12/29/16 278 lb 3.2 oz (126.2 kg)  09/29/16 280 lb 9 oz (127.3 kg)  03/24/16 271 lb (122.9 kg)    Physical Exam  Constitutional: He appears well-developed and well-nourished. No distress.  Obese, weight down >1 pound since last visit  HENT:  Head: Normocephalic and atraumatic.  Mouth/Throat: Mucous membranes are not dry.  Eyes: EOM are normal. No scleral icterus.  Neck: No thyromegaly present.  Cardiovascular: Normal rate and regular rhythm.   Pulmonary/Chest: Effort normal and breath sounds normal.  Abdominal: Soft. Bowel sounds are normal. He exhibits no distension.  Musculoskeletal: He exhibits no edema.  Neurological: Coordination normal.  Skin: Skin is warm and dry. No pallor.  Psychiatric: He has a normal mood and affect. His behavior is normal. Judgment and thought content normal.   Diabetic Foot Form - Detailed   Diabetic Foot Exam - detailed Diabetic Foot exam was performed with the following findings:  Yes 12/29/2016 11:42 AM  Visual Foot Exam completed.:  Yes  Are the toenails ingrown?:  No Normal Range of Motion:  Yes Pulse Foot Exam completed.:  Yes  Right Dorsalis Pedis:  Present Left Dorsalis Pedis:  Present  Sensory Foot Exam Completed.:   Yes Swelling:  No Semmes-Weinstein Monofilament Test R Site 1-Great Toe:  Pos L Site 1-Great Toe:  Pos  R Site 4:  Pos L Site 4:  Pos  R Site 5:  Pos L Site 5:  Pos        Results for orders placed or performed in visit on 09/29/16  Lipid panel  Result Value Ref Range   Cholesterol 152 <200 mg/dL   Triglycerides 241 (H) <150 mg/dL   HDL 32 (L) >40 mg/dL   Total CHOL/HDL Ratio 4.8 <5.0 Ratio   VLDL 48 (H) <30 mg/dL   LDL Cholesterol 72 <100 mg/dL  Comprehensive metabolic panel  Result Value Ref Range   Sodium 141 135 - 146 mmol/L   Potassium 4.4 3.5 - 5.3 mmol/L   Chloride 106 98 - 110 mmol/L   CO2 24 20 - 31 mmol/L   Glucose, Bld 155 (H) 65 - 99 mg/dL   BUN 9 7 - 25 mg/dL   Creat 1.18 0.60 - 1.35 mg/dL   Total Bilirubin 0.5 0.2 - 1.2 mg/dL   Alkaline Phosphatase 58 40 - 115 U/L   AST 18 10 - 40 U/L   ALT 20 9 - 46 U/L   Total Protein 6.6 6.1 - 8.1 g/dL   Albumin 4.1 3.6 - 5.1 g/dL   Calcium 9.3 8.6 - 10.3 mg/dL  Hemoglobin A1c  Result Value Ref Range   Hgb A1c MFr Bld 8.5 (H) <5.7 %   Mean Plasma Glucose 197 mg/dL  Microalbumin / creatinine urine ratio  Result Value Ref Range   Creatinine, Urine 121 20 - 370 mg/dL   Microalb, Ur 0.5 Not estab mg/dL   Microalb Creat Ratio 4 <30 mcg/mg creat      Assessment & Plan:   Problem List Items Addressed This Visit      Respiratory   Allergic rhinitis    Bad allergies, patient wishes to continue the antihistamine; we'll try adding singulair which will not affect dryness of eyes and maybe he can stop the allegra or at least use less often        Endocrine   Diabetes mellitus with hyperglycemia ([place])    Check a1c today; encouragement given for weight loss      Relevant Orders   Hemoglobin A1c     Other   Obesity    Continue to work on weight loss      Medication monitoring encounter  Reviewed last creatinine, K+, and SGPT; normal 3 months ago so no need to check today      Dyslipidemia    Switch vascepa  to lovaza because of cost      Relevant Medications   omega-3 acid ethyl esters (LOVAZA) 1 g capsule   Other Relevant Orders   Lipid panel    Other Visit Diagnoses    Dry eye syndrome, bilateral    -  Primary   Relevant Orders   Sjogrens syndrome-A extractable nuclear antibody   Sjogrens syndrome-B extractable nuclear antibody       Follow up plan: Return in about 3 months (around 03/31/2017) for twenty minute follow-up with fasting labs.  An after-visit summary was printed and given to the patient at [facility].  Please see the patient instructions which may contain other information and recommendations beyond what is mentioned above in the assessment and plan.  Meds ordered this encounter  Medications  . Multiple Vitamin (MULTIVITAMIN) capsule    Sig: Take 1 capsule by mouth daily.  . vitamin E (VITAMIN E) 1000 UNIT capsule    Sig: Take 1,000 Units by mouth daily.  . vitamin B-12 (CYANOCOBALAMIN) 1000 MCG tablet    Sig: Take 1,000 mcg by mouth daily.  . montelukast (SINGULAIR) 10 MG tablet    Sig: Take 1 tablet (10 mg total) by mouth at bedtime.    Dispense:  30 tablet    Refill:  12  . omega-3 acid ethyl esters (LOVAZA) 1 g capsule    Sig: Take 2 capsules (2 g total) by mouth 2 (two) times daily.    Dispense:  120 capsule    Refill:  12    Stop vascepa    Orders Placed This Encounter  Procedures  . Sjogrens syndrome-A extractable nuclear antibody  . Sjogrens syndrome-B extractable nuclear antibody  . Hemoglobin A1c  . Lipid panel    Send copy of SSA-SSB to eye doctor

## 2016-12-29 NOTE — Assessment & Plan Note (Signed)
Bad allergies, patient wishes to continue the antihistamine; we'll try adding singulair which will not affect dryness of eyes and maybe he can stop the allegra or at least use less often

## 2016-12-30 LAB — SJOGRENS SYNDROME-B EXTRACTABLE NUCLEAR ANTIBODY: SSB (LA) (ENA) ANTIBODY, IGG: NEGATIVE

## 2016-12-30 LAB — HEMOGLOBIN A1C
Hgb A1c MFr Bld: 9.7 % — ABNORMAL HIGH (ref <5.7)
Mean Plasma Glucose: 232 mg/dL

## 2016-12-30 LAB — SJOGRENS SYNDROME-A EXTRACTABLE NUCLEAR ANTIBODY: SSA (Ro) (ENA) Antibody, IgG: <1

## 2017-01-02 ENCOUNTER — Other Ambulatory Visit: Payer: Self-pay | Admitting: Family Medicine

## 2017-01-02 MED ORDER — LIRAGLUTIDE 18 MG/3ML ~~LOC~~ SOPN: PEN_INJECTOR | SUBCUTANEOUS | 0 refills | Status: DC

## 2017-01-02 MED ORDER — INSULIN PEN NEEDLE 31G X 6 MM MISC: 3 refills | Status: DC

## 2017-01-02 NOTE — Progress Notes (Signed)
Stop [place] Start victoza

## 2017-01-11 ENCOUNTER — Encounter: Payer: Self-pay | Admitting: *Deleted

## 2017-01-11 ENCOUNTER — Ambulatory Visit
Admission: EM | Admit: 2017-01-11 | Discharge: 2017-01-11 | Disposition: A | Discharge status: Home / Self Care | Payer: BLUE CROSS/BLUE SHIELD | Attending: Family Medicine | Admitting: Family Medicine

## 2017-01-11 DIAGNOSIS — R2231 Localized swelling, mass and lump, right upper limb: Secondary | ICD-10-CM

## 2017-01-11 DIAGNOSIS — L732 Hidradenitis suppurativa: Secondary | ICD-10-CM | POA: Diagnosis not present

## 2017-01-11 MED ORDER — HYDROCODONE-ACETAMINOPHEN 5-325 MG PO TABS
ORAL_TABLET | ORAL | 0 refills | Status: DC
Start: 2017-01-11 — End: 2017-04-06

## 2017-01-11 MED ORDER — DOXYCYCLINE HYCLATE 100 MG PO TABS: 100.0000 mg | ORAL_TABLET | Freq: Two times a day (BID) | ORAL | 0 refills | Status: DC

## 2017-01-11 NOTE — Discharge Instructions (Signed)
Warm compresses to affected areas

## 2017-01-11 NOTE — ED Triage Notes (Signed)
Multiple abscesses to armpits. Right is the worst. Duration more than a week.

## 2017-01-11 NOTE — ED Provider Notes (Signed)
[facility]    CSN: [id number] Arrival date & time: 01/11/17  0813     History   Chief Complaint Chief Complaint  Patient presents with  . Abscess    HPI Donald Baxter is a 41 y.o. male.   41 yo diabetic male with a c/o 1 week of skin lesions to axilla on both sides, right worse than left as well as skin lesions on the back of his neck and scalp. States this is recurrent problem. Denies any fevers, chills.     The history is provided by the patient.  Abscess    Past Medical History:  Diagnosis Date  . Bulge of cervical disc without myelopathy    between c3-c4  . Diabetes mellitus without complication ([place])   . Folliculitis   . Hyperlipidemia   . Low testosterone   . Obesity   . Psoriasis   . Tarsal tunnel syndrome     Patient Active Problem List   Diagnosis Date Noted  . Medication monitoring encounter 09/04/2015  . Low testosterone 02/12/2015  . Obesity 02/12/2015  . Diabetes mellitus with hyperglycemia ([place]) 02/12/2015  . Folliculitis 25/36/6440  . Allergic dermatitis 02/12/2015  . Dyslipidemia 02/12/2015  . Allergic rhinitis 02/12/2015    Past Surgical History:  Procedure Laterality Date  . ANKLE SURGERY Bilateral    tarsal tunnel        Home Medications    Prior to Admission medications   Medication Sig Start Date End Date Taking? Authorizing Provider  aspirin EC 81 MG tablet Take 81 mg by mouth daily.   Yes [provider]  fexofenadine (ALLEGRA) 180 MG tablet Take 180 mg by mouth 2 (two) times daily.   Yes [provider]  INVOKANA 300 MG TABS tablet TAKE ONE TABLET BY MOUTH BEFORE BREAKFAST. NEW HIGHER DOSE 03/31/16  Yes Lada, Satira Anis, MD  metFORMIN (GLUCOPHAGE-XR) 500 MG 24 hr tablet TAKE FOUR TABLETS BY MOUTH ONCE DAILY 03/31/16  Yes Lada, Satira Anis, MD  montelukast (SINGULAIR) 10 MG tablet Take 1 tablet (10 mg total) by mouth at bedtime. 12/29/16  Yes Lada, Satira Anis, MD  omega-3 acid ethyl esters (LOVAZA) 1 g  capsule Take 2 capsules (2 g total) by mouth 2 (two) times daily. 12/29/16  Yes Lada, Satira Anis, MD  simvastatin (ZOCOR) 40 MG tablet TAKE 1 TABLET(40 MG) BY MOUTH AT BEDTIME 10/27/16  Yes Lada, Satira Anis, MD  vitamin B-12 (CYANOCOBALAMIN) 1000 MCG tablet Take 1,000 mcg by mouth daily.   Yes [provider]  vitamin E (VITAMIN E) 1000 UNIT capsule Take 1,000 Units by mouth daily.   Yes [provider]  Blood Glucose Monitoring Suppl ([place]) W/DEVICE KIT Patient asked for VERIFLEX if that exists; staff found VERIO; please correct if that type exists; dx uncontrolled type 2 DM 02/13/15   Lada, Satira Anis, MD  doxycycline (VIBRA-TABS) 100 MG tablet Take 1 tablet (100 mg total) by mouth 2 (two) times daily. 01/11/17   Norval Gable, MD  glipiZIDE (GLUCOTROL XL) 10 MG 24 hr tablet Take 1 tablet (10 mg total) by mouth daily with breakfast. 10/04/16   Lada, Satira Anis, MD  glucose blood (ONETOUCH VERIO) test strip Check fingerstick blood sugars once a day on average; uncontrolled type 2 dm with hyperglycemia 02/13/15   Arnetha Courser, MD  HYDROcodone-acetaminophen (NORCO/VICODIN) 5-325 MG tablet 1-2 tabs po bid prn 01/11/17   Norval Gable, MD  Insulin Pen Needle 31G X 6 MM  MISC For use with Victoza, subcutaneous injection once a day; LON 99 Months; E11.65 01/02/17   Arnetha Courser, MD  liraglutide 18 MG/3ML SOPN Start with 0.6 mg injected subcutaneous every day for 1 week, then 1.2 mg daily x 1 week, then 1.8 mg daily; STOP januvia 01/02/17   Arnetha Courser, MD  Multiple Vitamin (MULTIVITAMIN) capsule Take 1 capsule by mouth daily.    [provider]  [facility] DELICA LANCETS FINE MISC Check fingerstick blood sugars once a day on average; uncontrolled type 2 dm with hyperglycemia 02/13/15   Lada, Satira Anis, MD  triamcinolone cream (KENALOG) 0.1 % Apply 1 application topically 2 (two) times daily as needed. 02/12/15   Arnetha Courser, MD    Family History Family  History  Problem Relation Age of Onset  . Hypertension Mother   . Hyperlipidemia Mother   . Diabetes Father   . Hypertension Father   . Hyperlipidemia Father   . Cancer Maternal Grandmother        not sure what kind  . Cancer Paternal Grandmother        lung?  . Stroke Paternal Grandmother   . Heart disease Paternal Grandfather        heart failure    Social History Social History  Substance Use Topics  . Smoking status: Never Smoker  . Smokeless tobacco: Never Used  . Alcohol use Yes     Comment: occasional     Allergies   Patient has no known allergies.   Review of Systems Review of Systems   Physical Exam Triage Vital Signs ED Triage Vitals  Enc Vitals Group     BP 01/11/17 0920 118/76     Pulse Rate 01/11/17 0920 96     Resp 01/11/17 0920 16     Temp 01/11/17 0920 97.8 F (36.6 C)     Temp Source 01/11/17 0920 Oral     SpO2 01/11/17 0920 99 %     Weight 01/11/17 0922 280 lb (127 kg)     Height 01/11/17 0922 6' (1.829 m)     Head Circumference --      Peak Flow --      Pain Score 01/11/17 0944 2     Pain Loc --      Pain Edu? --      Excl. in [place]? --    No data found.   Updated Vital Signs BP 118/76 (BP Location: Left Arm)   Pulse 96   Temp 97.8 F (36.6 C) (Oral)   Resp 16   Ht 6' (1.829 m)   Wt 280 lb (127 kg)   SpO2 99%   BMI 37.97 kg/m   Visual Acuity Right Eye Distance:   Left Eye Distance:   Bilateral Distance:    Right Eye Near:   Left Eye Near:    Bilateral Near:     Physical Exam  Constitutional: He appears well-developed and well-nourished. No distress.  Skin: He is not diaphoretic. There is erythema.  Multiple papulopustular superficial skin lesions on axilla bilaterally, neck and scalp with mild surrounding erythema and tenderness to palpation; also large (5x6) subcutaneous, deep well circumscribed, firm (not fluctuant) skin lesion on right axilla  Nursing note and vitals reviewed.    UC Treatments / Results   Labs (all labs ordered are listed, but only abnormal results are displayed) Labs Reviewed - No data to display  EKG  EKG Interpretation None       Radiology No  results found.  Procedures Procedures (including critical care time)  Medications Ordered in UC Medications - No data to display   Initial Impression / Assessment and Plan / UC Course  I have reviewed the triage vital signs and the nursing notes.  Pertinent labs & imaging results that were available during my care of the patient were reviewed by me and considered in my medical decision making (see chart for details).       Final Clinical Impressions(s) / UC Diagnoses   Final diagnoses:  Hidradenitis suppurativa  Axillary mass, right    New Prescriptions Discharge Medication List as of 01/11/2017  9:40 AM    START taking these medications   Details  doxycycline (VIBRA-TABS) 100 MG tablet Take 1 tablet (100 mg total) by mouth 2 (two) times daily., Starting Mon 01/11/2017, Normal    HYDROcodone-acetaminophen (NORCO/VICODIN) 5-325 MG tablet 1-2 tabs po bid prn, Print       1.diagnosis reviewed with patient 2. rx as per orders above; reviewed possible side effects, interactions, risks and benefits  3. Recommend supportive treatment with warm compresses to area 4. Recommend follow up with General Surgery for further evaluation of axillary mass 5. Follow-up prn if symptoms worsen or don't improve   Norval Gable, MD 01/11/17 1119

## 2017-01-12 ENCOUNTER — Encounter: Payer: Self-pay | Admitting: Family Medicine

## 2017-01-13 ENCOUNTER — Telehealth: Payer: Self-pay | Admitting: Family Medicine

## 2017-01-13 MED ORDER — DULAGLUTIDE 0.75 MG/0.5ML ~~LOC~~ SOAJ
SUBCUTANEOUS | 6 refills | Status: DC
Start: 2017-01-13 — End: 2017-04-06

## 2017-01-13 NOTE — Telephone Encounter (Signed)
Please let the patient know that his insurance wouldn't cover Victoza, so we'll use Trulicity instead

## 2017-01-14 NOTE — Telephone Encounter (Signed)
Left a detail messaged  

## 2017-02-02 ENCOUNTER — Other Ambulatory Visit: Payer: Self-pay | Admitting: Family Medicine

## 2017-02-08 DIAGNOSIS — H04123 Dry eye syndrome of bilateral lacrimal glands: Secondary | ICD-10-CM | POA: Diagnosis not present

## 2017-02-08 DIAGNOSIS — Z83511 Family history of glaucoma: Secondary | ICD-10-CM | POA: Diagnosis not present

## 2017-02-08 DIAGNOSIS — H018 Other specified inflammations of eyelid: Secondary | ICD-10-CM | POA: Diagnosis not present

## 2017-02-08 DIAGNOSIS — L718 Other rosacea: Secondary | ICD-10-CM | POA: Diagnosis not present

## 2017-03-31 ENCOUNTER — Ambulatory Visit: Payer: BLUE CROSS/BLUE SHIELD | Admitting: Family Medicine

## 2017-04-06 ENCOUNTER — Ambulatory Visit (INDEPENDENT_AMBULATORY_CARE_PROVIDER_SITE_OTHER): Payer: BLUE CROSS/BLUE SHIELD | Admitting: Family Medicine

## 2017-04-06 ENCOUNTER — Encounter: Payer: Self-pay | Admitting: Family Medicine

## 2017-04-06 VITALS — BP 132/74 | HR 100 | Temp 98.3°F | Resp 14 | Wt 284.9 lb

## 2017-04-06 DIAGNOSIS — E1165 Type 2 diabetes mellitus with hyperglycemia: Secondary | ICD-10-CM | POA: Diagnosis not present

## 2017-04-06 DIAGNOSIS — L02219 Cutaneous abscess of trunk, unspecified: Secondary | ICD-10-CM | POA: Diagnosis not present

## 2017-04-06 MED ORDER — HYDROCODONE-ACETAMINOPHEN 5-325 MG PO TABS: ORAL_TABLET | ORAL | 0 refills | Status: DC

## 2017-04-06 MED ORDER — SULFAMETHOXAZOLE-TRIMETHOPRIM 800-160 MG PO TABS: 1.0000 | ORAL_TABLET | Freq: Two times a day (BID) | ORAL | 0 refills | Status: AC

## 2017-04-06 NOTE — Patient Instructions (Addendum)
Please do eat yogurt daily or take a probiotic daily for the next month or two We want to replace the healthy germs in the gut If you notice foul, watery diarrhea in the next two months, schedule an appointment RIGHT AWAY Hands off -- do not try to drain these Use antibacterial soap Do not use deodorant or antiperspirant until underarms are clear; and after you are all cleared up, just use deodorant but NO antiperspirant Start the antibiotics Do not use cold packs against the infected areas If you develop fevers, feel poorly, feel weak, confused, etc, then absolutely go to the ER Keep me posted by phone or MyChart and please contact me if I can help   Skin Abscess A skin abscess is an infected area on or under your skin that contains pus and other material. An abscess can happen almost anywhere on your body. Some abscesses break open (rupture) on their own. Most continue to get worse unless they are treated. The infection can spread deeper into the body and into your blood, which can make you feel sick. Treatment usually involves draining the abscess. Follow these instructions at home: Abscess Care  If you have an abscess that has not drained, place a warm, clean, wet washcloth over the abscess several times a day. Do this as told by your doctor.  Follow instructions from your doctor about how to take care of your abscess. Make sure you: ? Cover the abscess with a bandage (dressing). ? Change your bandage or gauze as told by your doctor. ? Wash your hands with soap and water before you change the bandage or gauze. If you cannot use soap and water, use hand sanitizer.  Check your abscess every day for signs that the infection is getting worse. Check for: ? More redness, swelling, or pain. ? More fluid or blood. ? Warmth. ? More pus or a bad smell. Medicines   Take over-the-counter and prescription medicines only as told by your doctor.  If you were prescribed an antibiotic medicine,  take it as told by your doctor. Do not stop taking the antibiotic even if you start to feel better. General instructions  To avoid spreading the infection: ? Do not share personal care items, towels, or hot tubs with others. ? Avoid making skin-to-skin contact with other people.  Keep all follow-up visits as told by your doctor. This is important. Contact a doctor if:  You have more redness, swelling, or pain around your abscess.  You have more fluid or blood coming from your abscess.  Your abscess feels warm when you touch it.  You have more pus or a bad smell coming from your abscess.  You have a fever.  Your muscles ache.  You have chills.  You feel sick. Get help right away if:  You have very bad (severe) pain.  You see red streaks on your skin spreading away from the abscess. This information is not intended to replace advice given to you by your health care provider. Make sure you discuss any questions you have with your health care provider. Document Released: 01/20/2008 Document Revised: 03/29/2016 Document Reviewed: 06/12/2015 Elsevier Interactive Patient Education  [employer].

## 2017-04-06 NOTE — Progress Notes (Signed)
BP 132/74   Pulse 100   Temp 98.3 F (36.8 C) (Oral)   Resp 14   Wt 284 lb 14.4 oz (129.2 kg)   SpO2 96%   BMI 38.64 kg/m    Subjective:    Patient ID: Donald Baxter, male    DOB: [birth year], 41 y.o.   MRN: [id number]  HPI: Donald Baxter is a 41 y.o. male  Chief Complaint  Patient presents with  . Recurrent Skin Infections    bilateral under arms    HPI Patient is here for an acute visit Large boil on right side; popped up 2-3 days ago Many sores under both arms; spot under the right side is super sweaty; no sore there; nothing in gluteal  His wife watches some sort of pimple popping show and she picks at these and tries to drain them; he has lots of places under his armpits Lots of family stress and situations right now; understandably not checking sugars Was on trulicity; needle issue, just cannot use  Depression screen [facility] 2/9 04/06/2017 12/29/2016 09/29/2016 03/24/2016  Decreased Interest - 0 0 0  Down, Depressed, Hopeless 0 0 0 0  PHQ - 2 Score 0 0 0 0    Relevant past medical, surgical, family and social history reviewed Past Medical History:  Diagnosis Date  . Bulge of cervical disc without myelopathy    between c3-c4  . Diabetes mellitus without complication ([place])   . Folliculitis   . Hyperlipidemia   . Low testosterone   . Obesity   . Psoriasis   . Tarsal tunnel syndrome    Past Surgical History:  Procedure Laterality Date  . ANKLE SURGERY Bilateral    tarsal tunnel    Family History  Problem Relation Age of Onset  . Hypertension Mother   . Hyperlipidemia Mother   . Kidney disease Mother   . Diabetes Father   . Hypertension Father   . Hyperlipidemia Father   . Cancer Maternal Grandmother        not sure what kind  . Cancer Paternal Grandmother        lung?  . Stroke Paternal Grandmother   . Heart disease Paternal Grandfather        heart failure   Social History   Social History  . Marital status: Married    Spouse name: N/A  . Number  of children: N/A  . Years of education: N/A   Occupational History  . Not on file.   Social History Main Topics  . Smoking status: Never Smoker  . Smokeless tobacco: Never Used  . Alcohol use Yes     Comment: occasional  . Drug use: No  . Sexual activity: Yes   Other Topics Concern  . Not on file   Social History Narrative  . No narrative on file    Interim medical history since last visit reviewed. Allergies and medications reviewed  Review of Systems Per HPI unless specifically indicated above     Objective:    BP 132/74   Pulse 100   Temp 98.3 F (36.8 C) (Oral)   Resp 14   Wt 284 lb 14.4 oz (129.2 kg)   SpO2 96%   BMI 38.64 kg/m   Wt Readings from Last 3 Encounters:  04/06/17 284 lb 14.4 oz (129.2 kg)  01/11/17 280 lb (127 kg)  12/29/16 278 lb 3.2 oz (126.2 kg)    Physical Exam  Constitutional: He appears well-developed and well-nourished. No distress.  Cardiovascular:  Borderline tachycardia  Pulmonary/Chest: Effort normal.  Skin:     Large boil/abscess with surrounding cellulitis on the RIGHT side lateral chest; induration; warm to the touch; numerous other smaller lesions in the axillae which appear consistent with previous attempts at drainage, small scabs present, induration, erythema      Assessment & Plan:   Problem List Items Addressed This Visit      Endocrine   Diabetes mellitus with hyperglycemia ([place])    Likely impacting wound healing, ability to fight infection; he does not want to use medicine requiring needles (needle phobia); foot exam by MD today      Relevant Medications   sitaGLIPtin (JANUVIA) 100 MG tablet    Other Visit Diagnoses    Abscess of trunk    -  Primary   advised NO popping at home; brief cryo + #11 blade, small amount drained, covered with dry gauze; antibiotics, C diff prec; to ER if s/s of sepsis   Relevant Orders   Wound culture (Completed)       Follow up plan: No Follow-up on file.  An after-visit  summary was printed and given to the patient at [facility].  Please see the patient instructions which may contain other information and recommendations beyond what is mentioned above in the assessment and plan.  Meds ordered this encounter  Medications  . Zinc 100 MG TABS    Sig: Take 500 mg by mouth daily.  . sitaGLIPtin (JANUVIA) 100 MG tablet    Sig: Take 100 mg by mouth daily.  Marland Kitchen HYDROcodone-acetaminophen (NORCO/VICODIN) 5-325 MG tablet    Sig: One by mouth every four hours if needed for pain    Dispense:  8 tablet    Refill:  0  . sulfamethoxazole-trimethoprim (BACTRIM DS) 800-160 MG tablet    Sig: Take 1 tablet by mouth 2 (two) times daily.    Dispense:  14 tablet    Refill:  0    Orders Placed This Encounter  Procedures  . Wound culture

## 2017-04-09 LAB — WOUND CULTURE: Gram Stain: NONE SEEN

## 2017-04-13 NOTE — Assessment & Plan Note (Signed)
Likely impacting wound healing, ability to fight infection; he does not want to use medicine requiring needles (needle phobia); foot exam by MD today

## 2017-04-28 ENCOUNTER — Encounter: Payer: Self-pay | Admitting: Family Medicine

## 2017-05-02 DIAGNOSIS — L02412 Cutaneous abscess of left axilla: Secondary | ICD-10-CM | POA: Diagnosis not present

## 2017-05-06 DIAGNOSIS — L02412 Cutaneous abscess of left axilla: Secondary | ICD-10-CM | POA: Diagnosis not present

## 2017-06-22 ENCOUNTER — Encounter: Payer: Self-pay | Admitting: Family Medicine

## 2017-06-22 ENCOUNTER — Ambulatory Visit: Payer: BLUE CROSS/BLUE SHIELD | Admitting: Family Medicine

## 2017-06-22 VITALS — BP 126/72 | HR 94 | Temp 98.1°F | Resp 14 | Ht 73.0 in | Wt 272.8 lb

## 2017-06-22 DIAGNOSIS — E785 Hyperlipidemia, unspecified: Secondary | ICD-10-CM

## 2017-06-22 DIAGNOSIS — Z2821 Immunization not carried out because of patient refusal: Secondary | ICD-10-CM

## 2017-06-22 DIAGNOSIS — L239 Allergic contact dermatitis, unspecified cause: Secondary | ICD-10-CM

## 2017-06-22 DIAGNOSIS — Z5181 Encounter for therapeutic drug level monitoring: Secondary | ICD-10-CM | POA: Diagnosis not present

## 2017-06-22 DIAGNOSIS — E1165 Type 2 diabetes mellitus with hyperglycemia: Secondary | ICD-10-CM | POA: Diagnosis not present

## 2017-06-22 NOTE — Assessment & Plan Note (Signed)
Taking antihistamine.

## 2017-06-22 NOTE — Progress Notes (Signed)
BP 126/72   Pulse 94   Temp 98.1 F (36.7 C) (Oral)   Resp 14   Ht 6\' 1"  (1.854 m)   Wt 272 lb 12.8 oz (123.7 kg)   SpO2 96%   BMI 35.99 kg/m    Subjective:    Patient ID: Donald Baxter, male    DOB: 1975/12/23, 41 y.o.   MRN: [id number]  HPI: Donald Baxter is a 41 y.o. male  Chief Complaint  Patient presents with  . Form    heallth assesment   HPI Patient is here for a health assessment form for work; it's for his health insurance He did not bring the form but we looked at it on his phone; requires fasting lipids, waist circumference; no cotinine required, just check box if he smokes or not; he has never used tobacco products Waist circumference under the pannus 46", with pannus was 52"; staff got 51" Since the last visit, his mother died; 18 days later, his mother-in-law died He has type 2 diabetes, last A1c was Dec 29, 2016; it was uncontrolled, 9.7; last urine microalbumin:Cr was normal in February His cholesterol was abnormal in May as well; TG 334, HDL 31 He stopped all of his medicine He lost his full-time job and is only working part-time; it was actually a blessing at the time, had more time to spend with his mother; moved in with his mother for a time Was skipping medicine here and there and then just stopped  He got another boil on the left side, went to the ER and they gave him two antibiotics, then he went back and they lanced it and it oozed for days; since stopping the medicne, he has not had any other boils Some dry mouth, likely antihistamine Has been coughing lately; thinks it is from a spot in his throat, heartburn, acid reflux; took one of his wife's zantac and it really helped He has lost 12 pounds; weary about other people's food after his mother's illness, stress  Depression screen [facility] 2/9 06/22/2017 04/06/2017 12/29/2016 09/29/2016 03/24/2016  Decreased Interest 0 - 0 0 0  Down, Depressed, Hopeless 0 0 0 0 0  PHQ - 2 Score 0 0 0 0 0    Relevant past  medical, surgical, family and social history reviewed Past Medical History:  Diagnosis Date  . Bulge of cervical disc without myelopathy    between c3-c4  . Diabetes mellitus without complication ([place])   . Folliculitis   . Hyperlipidemia   . Low testosterone   . Obesity   . Psoriasis   . Tarsal tunnel syndrome    Past Surgical History:  Procedure Laterality Date  . ANKLE SURGERY Bilateral    tarsal tunnel    Family History  Problem Relation Age of Onset  . Hypertension Mother   . Hyperlipidemia Mother   . Kidney disease Mother   . Diabetes Father   . Hypertension Father   . Hyperlipidemia Father   . Cancer Maternal Grandmother        not sure what kind  . Cancer Paternal Grandmother        lung?  . Stroke Paternal Grandmother   . Heart disease Paternal Grandfather        heart failure   Social History   Tobacco Use  . Smoking status: Never Smoker  . Smokeless tobacco: Never Used  Substance Use Topics  . Alcohol use: Yes    Comment: occasional  . Drug use: No  Interim medical history since last visit reviewed. Allergies and medications reviewed  Review of Systems Per HPI unless specifically indicated above     Objective:    BP 126/72   Pulse 94   Temp 98.1 F (36.7 C) (Oral)   Resp 14   Ht 6\' 1"  (1.854 m)   Wt 272 lb 12.8 oz (123.7 kg)   SpO2 96%   BMI 35.99 kg/m   Wt Readings from Last 3 Encounters:  06/22/17 272 lb 12.8 oz (123.7 kg)  04/06/17 284 lb 14.4 oz (129.2 kg)  01/11/17 280 lb (127 kg)    Physical Exam  Constitutional: He appears well-developed and well-nourished. No distress.  Obese, weight down >12 pound since last visit  HENT:  Head: Normocephalic and atraumatic.  Mouth/Throat: Mucous membranes are not dry.  Eyes: EOM are normal. No scleral icterus.  Neck: No thyromegaly present.  Cardiovascular: Normal rate and regular rhythm.  Pulmonary/Chest: Effort normal and breath sounds normal.  Abdominal: Soft. Bowel sounds are  normal. He exhibits no distension.  Musculoskeletal: He exhibits no edema.  Neurological: Coordination normal.  Skin: Skin is warm and dry. No pallor.  Psychiatric: He has a normal mood and affect. His behavior is normal. Judgment and thought content normal.   Diabetic Foot Form - Detailed   Diabetic Foot Exam - detailed Diabetic Foot exam was performed with the following findings:  Yes 06/22/2017 11:08 AM  Visual Foot Exam completed.:  Yes  Pulse Foot Exam completed.:  Yes  Right Dorsalis Pedis:  Present Left Dorsalis Pedis:  Present  Sensory Foot Exam Completed.:  Yes Semmes-Weinstein Monofilament Test R Site 1-Great Toe:  Pos L Site 1-Great Toe:  Pos          Assessment & Plan:   Problem List Items Addressed This Visit      Endocrine   Diabetes mellitus with hyperglycemia ([place]) - Primary    Check A1c and glucose today; he is working on weight loss; he has stopped all of his medicines; he wishes to see what the labs show before starting anything back, so we'll review those and decide tomorrow      Relevant Orders   Lipid panel   Hemoglobin A1c     Musculoskeletal and Integument   Allergic dermatitis    Taking antihistamine        Other   Morbid obesity ([place])    He is working on it; his body shape is such that he has truncal obesity; BMI >35 with type 2 diabetes, which puts this in the category of morbid obesity      Medication monitoring encounter    Check liver and kidneys      Relevant Orders   COMPLETE METABOLIC PANEL WITH GFR   Dyslipidemia    Check labs      Relevant Orders   Lipid panel    Other Visit Diagnoses    Influenza vaccine refused       patient declined flu vaccine; see AVS      Condolences given for loss of mother and mother-in-law  Follow up plan: Return in about 3 months (around 09/22/2017) for twenty minute follow-up with fasting labs.  An after-visit summary was printed and given to the patient at [place].  Please see the patient  instructions which may contain other information and recommendations beyond what is mentioned above in the assessment and plan.  No orders of the defined types were placed in this encounter.   Orders Placed This  Encounter  Procedures  . Lipid panel  . Hemoglobin A1c  . COMPLETE METABOLIC PANEL WITH GFR

## 2017-06-22 NOTE — Assessment & Plan Note (Signed)
Check labs 

## 2017-06-22 NOTE — Assessment & Plan Note (Signed)
Check liver and kidneys 

## 2017-06-22 NOTE — Assessment & Plan Note (Addendum)
Check A1c and glucose today; he is working on weight loss; he has stopped all of his medicines; he wishes to see what the labs show before starting anything back, so we'll review those and decide tomorrow

## 2017-06-22 NOTE — Patient Instructions (Addendum)
Let's get labs today Bring the form by at your convenience I do recommend yearly flu shots; for individuals who don't want flu shots, try to practice excellent hand hygiene, and avoid nursing homes, day cares, and hospitals during peak flu season; taking additional vitamin C daily during flu/cold season may help boost your immune system too

## 2017-06-22 NOTE — Assessment & Plan Note (Addendum)
He is working on it; his body shape is such that he has truncal obesity; BMI >35 with type 2 diabetes, which puts this in the category of morbid obesity

## 2017-06-23 LAB — COMPLETE METABOLIC PANEL WITH GFR
AG Ratio: 1.5 (calc) (ref 1.0–2.5)
ALKALINE PHOSPHATASE (APISO): 67 U/L (ref 40–115)
ALT: 20 U/L (ref 9–46)
AST: 15 U/L (ref 10–40)
Albumin: 4.2 g/dL (ref 3.6–5.1)
BUN: 13 mg/dL (ref 7–25)
CALCIUM: 9.3 mg/dL (ref 8.6–10.3)
CO2: 25 mmol/L (ref 20–32)
CREATININE: 1.06 mg/dL (ref 0.60–1.35)
Chloride: 100 mmol/L (ref 98–110)
GFR, EST NON AFRICAN AMERICAN: 86 mL/min/{1.73_m2} (ref >=60)
GFR, Est African American: 100 mL/min/{1.73_m2} (ref >=60)
GLOBULIN: 2.8 g/dL (ref 1.9–3.7)
GLUCOSE: 308 mg/dL — AB (ref 65–99)
Potassium: 4.2 mmol/L (ref 3.5–5.3)
SODIUM: 133 mmol/L — AB (ref 135–146)
Total Bilirubin: 0.6 mg/dL (ref 0.2–1.2)
Total Protein: 7 g/dL (ref 6.1–8.1)

## 2017-06-23 LAB — HEMOGLOBIN A1C
EAG (MMOL/L): 15.5 (calc)
Hgb A1c MFr Bld: 11.4 % of total Hgb — ABNORMAL HIGH (ref <5.7)
MEAN PLASMA GLUCOSE: 280 (calc)

## 2017-06-23 LAB — LIPID PANEL
CHOL/HDL RATIO: 9.5 (calc) — AB (ref <5.0)
Cholesterol: 303 mg/dL — ABNORMAL HIGH (ref <200)
HDL: 32 mg/dL — ABNORMAL LOW (ref >40)
NON-HDL CHOLESTEROL (CALC): 271 mg/dL — AB (ref <130)
Triglycerides: 576 mg/dL — ABNORMAL HIGH (ref <150)

## 2017-06-24 ENCOUNTER — Other Ambulatory Visit: Payer: Self-pay | Admitting: Family Medicine

## 2017-06-24 MED ORDER — OMEGA-3-ACID ETHYL ESTERS 1 G PO CAPS: 2.0000 g | ORAL_CAPSULE | Freq: Two times a day (BID) | ORAL | 12 refills | Status: DC

## 2017-06-24 MED ORDER — RANITIDINE HCL 150 MG PO TABS: 150.0000 mg | ORAL_TABLET | Freq: Two times a day (BID) | ORAL | 6 refills | Status: DC

## 2017-06-24 MED ORDER — CANAGLIFLOZIN 300 MG PO TABS: ORAL_TABLET | ORAL | 5 refills | Status: DC

## 2017-06-24 MED ORDER — SITAGLIPTIN PHOSPHATE 100 MG PO TABS: 100.0000 mg | ORAL_TABLET | Freq: Every day | ORAL | 6 refills | Status: DC

## 2017-06-24 MED ORDER — METFORMIN HCL ER 500 MG PO TB24: 2000.0000 mg | ORAL_TABLET | Freq: Every day | ORAL | 6 refills | Status: DC

## 2017-06-24 MED ORDER — SIMVASTATIN 40 MG PO TABS: ORAL_TABLET | ORAL | 6 refills | Status: DC

## 2017-06-24 NOTE — Progress Notes (Signed)
Start back on med, not start sulfonylurea; discussed in person

## 2017-06-24 NOTE — Progress Notes (Signed)
Either H2 blocke ris fine

## 2017-07-14 ENCOUNTER — Other Ambulatory Visit: Payer: Self-pay | Admitting: Family Medicine

## 2017-07-14 NOTE — Telephone Encounter (Signed)
Please verify with patient if he wants to take Lovaza or Vascepa for his triglycerides They both do the same thing so I don't want him on both Thank you

## 2017-09-22 ENCOUNTER — Ambulatory Visit: Payer: BLUE CROSS/BLUE SHIELD | Admitting: Family Medicine

## 2017-09-30 NOTE — Telephone Encounter (Signed)
Message routed to [facility] 07/14/17 --------------------------------------------- Please see note and address, then back to me for refill response Thank you

## 2017-10-09 DIAGNOSIS — S3991XA Unspecified injury of abdomen, initial encounter: Secondary | ICD-10-CM | POA: Diagnosis not present

## 2017-10-09 DIAGNOSIS — S0990XA Unspecified injury of head, initial encounter: Secondary | ICD-10-CM | POA: Diagnosis not present

## 2017-10-09 DIAGNOSIS — E119 Type 2 diabetes mellitus without complications: Secondary | ICD-10-CM | POA: Diagnosis not present

## 2017-10-09 DIAGNOSIS — R51 Headache: Secondary | ICD-10-CM | POA: Diagnosis not present

## 2017-10-09 DIAGNOSIS — Z79899 Other long term (current) drug therapy: Secondary | ICD-10-CM | POA: Diagnosis not present

## 2017-10-09 DIAGNOSIS — Z7984 Long term (current) use of oral hypoglycemic drugs: Secondary | ICD-10-CM | POA: Diagnosis not present

## 2017-10-09 DIAGNOSIS — S3993XA Unspecified injury of pelvis, initial encounter: Secondary | ICD-10-CM | POA: Diagnosis not present

## 2017-10-09 DIAGNOSIS — S0081XA Abrasion of other part of head, initial encounter: Secondary | ICD-10-CM | POA: Diagnosis not present

## 2017-10-09 DIAGNOSIS — S3992XA Unspecified injury of lower back, initial encounter: Secondary | ICD-10-CM | POA: Diagnosis not present

## 2017-10-09 DIAGNOSIS — S199XXA Unspecified injury of neck, initial encounter: Secondary | ICD-10-CM | POA: Diagnosis not present

## 2017-10-09 DIAGNOSIS — Z7982 Long term (current) use of aspirin: Secondary | ICD-10-CM | POA: Diagnosis not present

## 2017-10-09 DIAGNOSIS — S0003XA Contusion of scalp, initial encounter: Secondary | ICD-10-CM | POA: Diagnosis not present

## 2017-10-09 DIAGNOSIS — E785 Hyperlipidemia, unspecified: Secondary | ICD-10-CM | POA: Diagnosis not present

## 2017-10-09 DIAGNOSIS — S299XXA Unspecified injury of thorax, initial encounter: Secondary | ICD-10-CM | POA: Diagnosis not present

## 2017-10-10 DIAGNOSIS — S0003XA Contusion of scalp, initial encounter: Secondary | ICD-10-CM | POA: Diagnosis not present

## 2017-10-13 ENCOUNTER — Ambulatory Visit
Admission: EM | Admit: 2017-10-13 | Discharge: 2017-10-13 | Disposition: A | Discharge status: Home / Self Care | Payer: BLUE CROSS/BLUE SHIELD | Attending: Family Medicine | Admitting: Family Medicine

## 2017-10-13 ENCOUNTER — Other Ambulatory Visit: Payer: Self-pay

## 2017-10-13 ENCOUNTER — Encounter: Payer: Self-pay | Admitting: Gynecology

## 2017-10-13 DIAGNOSIS — J0100 Acute maxillary sinusitis, unspecified: Secondary | ICD-10-CM

## 2017-10-13 DIAGNOSIS — R05 Cough: Secondary | ICD-10-CM | POA: Diagnosis not present

## 2017-10-13 DIAGNOSIS — R059 Cough, unspecified: Secondary | ICD-10-CM

## 2017-10-13 MED ORDER — AMOXICILLIN 875 MG PO TABS: 875.0000 mg | ORAL_TABLET | Freq: Two times a day (BID) | ORAL | 0 refills | Status: DC

## 2017-10-13 NOTE — ED Provider Notes (Signed)
[facility]    CSN: [id number] Arrival date & time: 10/13/17  1021     History   Chief Complaint Chief Complaint  Patient presents with  . Cough  . Sore Throat    HPI Donald Baxter is a 42 y.o. male.   The history is provided by the patient.  Cough  Associated symptoms: headaches and rhinorrhea   Associated symptoms: no fever   Sore Throat  Associated symptoms include headaches.  URI  Presenting symptoms: congestion, cough and rhinorrhea   Presenting symptoms: no fever   Severity:  Moderate Onset quality:  Sudden Duration:  3 weeks Timing:  Constant Progression:  Worsening Chronicity:  New Relieved by:  Nothing Ineffective treatments:  OTC medications Associated symptoms: headaches and sinus pain   Risk factors: diabetes mellitus and sick contacts   Risk factors: not elderly, no chronic cardiac disease, no chronic kidney disease, no chronic respiratory disease, no immunosuppression, no recent illness and no recent travel     Past Medical History:  Diagnosis Date  . Bulge of cervical disc without myelopathy    between c3-c4  . Diabetes mellitus without complication ([place])   . Folliculitis   . Hyperlipidemia   . Low testosterone   . Obesity   . Psoriasis   . Tarsal tunnel syndrome     Patient Active Problem List   Diagnosis Date Noted  . Medication monitoring encounter 09/04/2015  . Low testosterone 02/12/2015  . Morbid obesity ([place]) 02/12/2015  . Diabetes mellitus with hyperglycemia ([place]) 02/12/2015  . Allergic dermatitis 02/12/2015  . Dyslipidemia 02/12/2015  . Allergic rhinitis 02/12/2015    Past Surgical History:  Procedure Laterality Date  . ANKLE SURGERY Bilateral    tarsal tunnel        Home Medications    Prior to Admission medications   Medication Sig Start Date End Date Taking? Authorizing Provider  aspirin EC 81 MG tablet Take 81 mg by mouth daily.   Yes [provider]  Blood Glucose Monitoring Suppl  ([place]) W/DEVICE KIT Patient asked for VERIFLEX if that exists; staff found VERIO; please correct if that type exists; dx uncontrolled type 2 DM 02/13/15  Yes Lada, Satira Anis, MD  canagliflozin (INVOKANA) 300 MG TABS tablet TAKE 1 TABLET BY MOUTH EVERY MORNING BEFORE BREAKFAST 06/24/17  Yes Lada, Satira Anis, MD  fexofenadine (ALLEGRA) 180 MG tablet Take 180 mg by mouth 2 (two) times daily.   Yes [provider]  glucose blood (ONETOUCH VERIO) test strip Check fingerstick blood sugars once a day on average; uncontrolled type 2 dm with hyperglycemia 02/13/15  Yes Lada, Satira Anis, MD  metFORMIN (GLUCOPHAGE-XR) 500 MG 24 hr tablet Take 4 tablets (2,000 mg total) daily by mouth. 06/24/17  Yes Lada, Satira Anis, MD  omega-3 acid ethyl esters (LOVAZA) 1 g capsule Take 2 capsules (2 g total) 2 (two) times daily by mouth. 06/24/17  Yes Lada, Satira Anis, MD  [facility] DELICA LANCETS FINE MISC Check fingerstick blood sugars once a day on average; uncontrolled type 2 dm with hyperglycemia 02/13/15  Yes Lada, Satira Anis, MD  ranitidine (ZANTAC) 150 MG tablet Take 1 tablet (150 mg total) 2 (two) times daily by mouth. 06/24/17  Yes Lada, Satira Anis, MD  simvastatin (ZOCOR) 40 MG tablet TAKE 1 TABLET(40 MG) BY MOUTH AT BEDTIME 06/24/17  Yes Lada, Satira Anis, MD  sitaGLIPtin (JANUVIA) 100 MG tablet Take 1 tablet (100 mg total) daily by mouth. 06/24/17  Yes  Arnetha Courser, MD  amoxicillin (AMOXIL) 875 MG tablet Take 1 tablet (875 mg total) by mouth 2 (two) times daily. 10/13/17   Norval Gable, MD  HYDROcodone-acetaminophen (NORCO/VICODIN) 5-325 MG tablet One by mouth every four hours if needed for pain Patient not taking: Reported on 06/22/2017 04/06/17   Arnetha Courser, MD  Multiple Vitamin (MULTIVITAMIN) capsule Take 1 capsule by mouth daily.    [provider]  triamcinolone cream (KENALOG) 0.1 % Apply 1 application topically 2 (two) times daily as needed. Patient not taking: Reported on  06/22/2017 02/12/15   Arnetha Courser, MD  vitamin B-12 (CYANOCOBALAMIN) 1000 MCG tablet Take 1,000 mcg by mouth daily.    [provider]  vitamin E (VITAMIN E) 1000 UNIT capsule Take 1,000 Units by mouth daily.    [provider]  Zinc 100 MG TABS Take 500 mg by mouth daily.    [provider]    Family History Family History  Problem Relation Age of Onset  . Hypertension Mother   . Hyperlipidemia Mother   . Kidney disease Mother   . Diabetes Father   . Hypertension Father   . Hyperlipidemia Father   . Cancer Maternal Grandmother        not sure what kind  . Cancer Paternal Grandmother        lung?  . Stroke Paternal Grandmother   . Heart disease Paternal Grandfather        heart failure    Social History Social History   Tobacco Use  . Smoking status: Never Smoker  . Smokeless tobacco: Never Used  Substance Use Topics  . Alcohol use: Yes    Comment: occasional  . Drug use: No     Allergies   Patient has no known allergies.   Review of Systems Review of Systems  Constitutional: Negative for fever.  HENT: Positive for congestion, rhinorrhea and sinus pain.   Respiratory: Positive for cough.   Neurological: Positive for headaches.     Physical Exam Triage Vital Signs ED Triage Vitals [10/13/17 1110]  Enc Vitals Group     BP 111/65     Pulse Rate 93     Resp 18     Temp 98.2 F (36.8 C)     Temp Source Oral     SpO2 98 %     Weight 275 lb (124.7 kg)     Height _0  (1.88 m)     Head Circumference      Peak Flow      Pain Score 4     Pain Loc      Pain Edu?      Excl. in [place]?    No data found.  Updated Vital Signs BP 111/65 (BP Location: Left Arm)   Pulse 93   Temp 98.2 F (36.8 C) (Oral)   Resp 18   Ht _1  (1.88 m)   Wt 275 lb (124.7 kg)   SpO2 98%   BMI 35.31 kg/m   Visual Acuity Right Eye Distance:   Left Eye Distance:   Bilateral Distance:    Right Eye Near:   Left Eye Near:    Bilateral Near:      Physical Exam  Constitutional: He appears well-developed and well-nourished. No distress.  HENT:  Head: Normocephalic and atraumatic.  Right Ear: Tympanic membrane, external ear and ear canal normal.  Left Ear: Tympanic membrane, external ear and ear canal normal.  Nose: Right sinus exhibits maxillary  sinus tenderness and frontal sinus tenderness. Left sinus exhibits maxillary sinus tenderness and frontal sinus tenderness.  Mouth/Throat: Uvula is midline, oropharynx is clear and moist and mucous membranes are normal. No oropharyngeal exudate or tonsillar abscesses.  Eyes: Conjunctivae and EOM are normal. Pupils are equal, round, and reactive to light. Right eye exhibits no discharge. Left eye exhibits no discharge. No scleral icterus.  Neck: Normal range of motion. Neck supple. No tracheal deviation present. No thyromegaly present.  Cardiovascular: Normal rate, regular rhythm and normal heart sounds.  Pulmonary/Chest: Effort normal and breath sounds normal. No stridor. No respiratory distress. He has no wheezes. He has no rales. He exhibits no tenderness.  Lymphadenopathy:    He has no cervical adenopathy.  Neurological: He is alert.  Skin: Skin is warm and dry. No rash noted. He is not diaphoretic.  Nursing note and vitals reviewed.    UC Treatments / Results  Labs (all labs ordered are listed, but only abnormal results are displayed) Labs Reviewed - No data to display  EKG  EKG Interpretation None       Radiology No results found.  Procedures Procedures (including critical care time)  Medications Ordered in UC Medications - No data to display   Initial Impression / Assessment and Plan / UC Course  I have reviewed the triage vital signs and the nursing notes.  Pertinent labs & imaging results that were available during my care of the patient were reviewed by me and considered in my medical decision making (see chart for details).       Final Clinical  Impressions(s) / UC Diagnoses   Final diagnoses:  Acute maxillary sinusitis, recurrence not specified  Cough    ED Discharge Orders        Ordered    amoxicillin (AMOXIL) 875 MG tablet  2 times daily     10/13/17 1157     1. diagnosis reviewed with patient 2. rx as per orders above; reviewed possible side effects, interactions, risks and benefits  3. Recommend supportive treatment with rest, fluids 4. Follow-up prn if symptoms worsen or don't improve  Controlled Substance Prescriptions [place] Controlled Substance Registry consulted? Not Applicable   Norval Gable, MD 10/13/17 1208

## 2017-10-13 NOTE — ED Triage Notes (Signed)
Patient c/o of cough x 3 weeks.

## 2017-10-19 NOTE — Telephone Encounter (Signed)
Pt sates will make appt to discuss

## 2017-11-07 ENCOUNTER — Other Ambulatory Visit: Payer: Self-pay

## 2017-11-07 ENCOUNTER — Ambulatory Visit
Admission: EM | Admit: 2017-11-07 | Discharge: 2017-11-07 | Disposition: A | Discharge status: Home / Self Care | Payer: BLUE CROSS/BLUE SHIELD | Attending: Family Medicine | Admitting: Family Medicine

## 2017-11-07 DIAGNOSIS — J40 Bronchitis, not specified as acute or chronic: Secondary | ICD-10-CM | POA: Diagnosis not present

## 2017-11-07 DIAGNOSIS — R05 Cough: Secondary | ICD-10-CM | POA: Diagnosis not present

## 2017-11-07 DIAGNOSIS — R059 Cough, unspecified: Secondary | ICD-10-CM

## 2017-11-07 MED ORDER — AZITHROMYCIN 250 MG PO TABS: ORAL_TABLET | ORAL | 0 refills | Status: DC

## 2017-11-07 MED ORDER — BENZONATATE 200 MG PO CAPS: 200.0000 mg | ORAL_CAPSULE | Freq: Three times a day (TID) | ORAL | 0 refills | Status: DC | PRN

## 2017-11-07 NOTE — ED Triage Notes (Signed)
Pt reports chest and nasal congestion x one month. Was treated with ABX for sinus infection. Has been coughing a lot. Reports temp 99.8 last p.m

## 2017-11-07 NOTE — ED Provider Notes (Signed)
[facility]    CSN: [id number] Arrival date & time: 11/07/17  1235     History   Chief Complaint Chief Complaint  Patient presents with  . Nasal Congestion  . Cough    HPI Donald Baxter is a 42 y.o. male.   The history is provided by the patient.  Cough  Cough characteristics:  Productive Associated symptoms: fever   URI  Presenting symptoms: congestion, cough and fever   Severity:  Moderate Onset quality:  Sudden Duration:  3 weeks Timing:  Constant Progression:  Worsening Chronicity:  New Relieved by:  Nothing Ineffective treatments:  OTC medications and prescription medications Risk factors: diabetes mellitus and recent illness     Past Medical History:  Diagnosis Date  . Bulge of cervical disc without myelopathy    between c3-c4  . Diabetes mellitus without complication ([place])   . Folliculitis   . Hyperlipidemia   . Low testosterone   . Obesity   . Psoriasis   . Tarsal tunnel syndrome     Patient Active Problem List   Diagnosis Date Noted  . Medication monitoring encounter 09/04/2015  . Low testosterone 02/12/2015  . Morbid obesity ([place]) 02/12/2015  . Diabetes mellitus with hyperglycemia ([place]) 02/12/2015  . Allergic dermatitis 02/12/2015  . Dyslipidemia 02/12/2015  . Allergic rhinitis 02/12/2015    Past Surgical History:  Procedure Laterality Date  . ANKLE SURGERY Bilateral    tarsal tunnel        Home Medications    Prior to Admission medications   Medication Sig Start Date End Date Taking? Authorizing Provider  amoxicillin (AMOXIL) 875 MG tablet Take 1 tablet (875 mg total) by mouth 2 (two) times daily. 10/13/17   Norval Gable, MD  aspirin EC 81 MG tablet Take 81 mg by mouth daily.    [provider]  azithromycin (ZITHROMAX Z-PAK) 250 MG tablet 2 tabs po once day 1, then 1 tab po qd for next 4 days 11/07/17   Norval Gable, MD  benzonatate (TESSALON) 200 MG capsule Take 1 capsule (200 mg total) by mouth 3 (three)  times daily as needed. 11/07/17   Norval Gable, MD  Blood Glucose Monitoring Suppl ([place]) W/DEVICE KIT Patient asked for VERIFLEX if that exists; staff found VERIO; please correct if that type exists; dx uncontrolled type 2 DM 02/13/15   Lada, Satira Anis, MD  canagliflozin (INVOKANA) 300 MG TABS tablet TAKE 1 TABLET BY MOUTH EVERY MORNING BEFORE BREAKFAST 06/24/17   Lada, Satira Anis, MD  fexofenadine (ALLEGRA) 180 MG tablet Take 180 mg by mouth 2 (two) times daily.    [provider]  glucose blood (ONETOUCH VERIO) test strip Check fingerstick blood sugars once a day on average; uncontrolled type 2 dm with hyperglycemia 02/13/15   Lada, Satira Anis, MD  HYDROcodone-acetaminophen (NORCO/VICODIN) 5-325 MG tablet One by mouth every four hours if needed for pain Patient not taking: Reported on 06/22/2017 04/06/17   Arnetha Courser, MD  metFORMIN (GLUCOPHAGE-XR) 500 MG 24 hr tablet Take 4 tablets (2,000 mg total) daily by mouth. 06/24/17   Arnetha Courser, MD  Multiple Vitamin (MULTIVITAMIN) capsule Take 1 capsule by mouth daily.    [provider]  omega-3 acid ethyl esters (LOVAZA) 1 g capsule Take 2 capsules (2 g total) 2 (two) times daily by mouth. 06/24/17   Arnetha Courser, MD  ONETOUCH DELICA LANCETS FINE MISC Check fingerstick blood sugars once a day on average; uncontrolled type 2  dm with hyperglycemia 02/13/15   Lada, Satira Anis, MD  ranitidine (ZANTAC) 150 MG tablet Take 1 tablet (150 mg total) 2 (two) times daily by mouth. 06/24/17   Arnetha Courser, MD  simvastatin (ZOCOR) 40 MG tablet TAKE 1 TABLET(40 MG) BY MOUTH AT BEDTIME 06/24/17   Lada, Satira Anis, MD  sitaGLIPtin (JANUVIA) 100 MG tablet Take 1 tablet (100 mg total) daily by mouth. 06/24/17   Arnetha Courser, MD  triamcinolone cream (KENALOG) 0.1 % Apply 1 application topically 2 (two) times daily as needed. Patient not taking: Reported on 06/22/2017 02/12/15   Arnetha Courser, MD  vitamin B-12 (CYANOCOBALAMIN)  1000 MCG tablet Take 1,000 mcg by mouth daily.    [provider]  vitamin E (VITAMIN E) 1000 UNIT capsule Take 1,000 Units by mouth daily.    [provider]  Zinc 100 MG TABS Take 500 mg by mouth daily.    [provider]    Family History Family History  Problem Relation Age of Onset  . Hypertension Mother   . Hyperlipidemia Mother   . Kidney disease Mother   . Diabetes Father   . Hypertension Father   . Hyperlipidemia Father   . Cancer Maternal Grandmother        not sure what kind  . Cancer Paternal Grandmother        lung?  . Stroke Paternal Grandmother   . Heart disease Paternal Grandfather        heart failure    Social History Social History   Tobacco Use  . Smoking status: Never Smoker  . Smokeless tobacco: Never Used  Substance Use Topics  . Alcohol use: Yes    Comment: occasional  . Drug use: No     Allergies   Patient has no known allergies.   Review of Systems Review of Systems  Constitutional: Positive for fever.  HENT: Positive for congestion.   Respiratory: Positive for cough.      Physical Exam Triage Vital Signs ED Triage Vitals  Enc Vitals Group     BP 11/07/17 1250 121/75     Pulse Rate 11/07/17 1250 100     Resp 11/07/17 1250 20     Temp 11/07/17 1250 98.7 F (37.1 C)     Temp Source 11/07/17 1250 Oral     SpO2 11/07/17 1250 100 %     Weight 11/07/17 1252 275 lb (124.7 kg)     Height 11/07/17 1252 '6\' 2"'  (1.88 m)     Head Circumference --      Peak Flow --      Pain Score 11/07/17 1251 4     Pain Loc --      Pain Edu? --      Excl. in [place]? --    No data found.  Updated Vital Signs BP 121/75 (BP Location: Left Arm)   Pulse 100   Temp 98.7 F (37.1 C) (Oral)   Resp 20   Ht '6\' 2"'  (1.88 m)   Wt 275 lb (124.7 kg)   SpO2 100%   BMI 35.31 kg/m   Visual Acuity Right Eye Distance:   Left Eye Distance:   Bilateral Distance:    Right Eye Near:   Left Eye Near:    Bilateral Near:     Physical  Exam  Constitutional: He appears well-developed and well-nourished. No distress.  HENT:  Head: Normocephalic and atraumatic.  Right Ear: Tympanic membrane, external ear and ear canal normal.  Left Ear: Tympanic membrane, external ear and ear canal normal.  Nose: Nose normal.  Mouth/Throat: Uvula is midline, oropharynx is clear and moist and mucous membranes are normal. No oropharyngeal exudate or tonsillar abscesses.  Eyes: Pupils are equal, round, and reactive to light. Conjunctivae and EOM are normal. Right eye exhibits no discharge. Left eye exhibits no discharge. No scleral icterus.  Neck: Normal range of motion. Neck supple. No tracheal deviation present. No thyromegaly present.  Cardiovascular: Normal rate, regular rhythm and normal heart sounds.  Pulmonary/Chest: Effort normal. No stridor. No respiratory distress. He has no wheezes. He has no rales. He exhibits no tenderness.  Diffuse rhonchi  Lymphadenopathy:    He has no cervical adenopathy.  Neurological: He is alert.  Skin: Skin is warm and dry. No rash noted. He is not diaphoretic.  Nursing note and vitals reviewed.    UC Treatments / Results  Labs (all labs ordered are listed, but only abnormal results are displayed) Labs Reviewed - No data to display  EKG None Radiology No results found.  Procedures Procedures (including critical care time)  Medications Ordered in UC Medications - No data to display   Initial Impression / Assessment and Plan / UC Course  I have reviewed the triage vital signs and the nursing notes.  Pertinent labs & imaging results that were available during my care of the patient were reviewed by me and considered in my medical decision making (see chart for details).       Final Clinical Impressions(s) / UC Diagnoses   Final diagnoses:  Cough  Bronchitis    ED Discharge Orders        Ordered    azithromycin (ZITHROMAX Z-PAK) 250 MG tablet     11/07/17 1352    benzonatate  (TESSALON) 200 MG capsule  3 times daily PRN     11/07/17 1353     1. diagnosis reviewed with patient 2. rx as per orders above; reviewed possible side effects, interactions, risks and benefits  3. Recommend supportive treatment with fluids, otc meds prn  4. Follow-up prn if symptoms worsen or don't improve  Controlled Substance Prescriptions [place] Controlled Substance Registry consulted? Not Applicable   Norval Gable, MD 11/07/17 1359

## 2017-11-16 ENCOUNTER — Ambulatory Visit: Payer: Self-pay | Admitting: Family Medicine

## 2017-12-29 ENCOUNTER — Ambulatory Visit: Payer: BLUE CROSS/BLUE SHIELD | Admitting: Family Medicine

## 2017-12-29 ENCOUNTER — Encounter: Payer: Self-pay | Admitting: Family Medicine

## 2017-12-29 VITALS — BP 118/78 | HR 95 | Temp 97.9°F | Resp 14 | Ht 73.0 in | Wt 263.5 lb

## 2017-12-29 DIAGNOSIS — E669 Obesity, unspecified: Secondary | ICD-10-CM

## 2017-12-29 DIAGNOSIS — J3089 Other allergic rhinitis: Secondary | ICD-10-CM

## 2017-12-29 DIAGNOSIS — E1165 Type 2 diabetes mellitus with hyperglycemia: Secondary | ICD-10-CM | POA: Diagnosis not present

## 2017-12-29 DIAGNOSIS — B379 Candidiasis, unspecified: Secondary | ICD-10-CM

## 2017-12-29 DIAGNOSIS — L02213 Cutaneous abscess of chest wall: Secondary | ICD-10-CM

## 2017-12-29 DIAGNOSIS — IMO0002 Reserved for concepts with insufficient information to code with codable children: Secondary | ICD-10-CM | POA: Insufficient documentation

## 2017-12-29 DIAGNOSIS — Z5181 Encounter for therapeutic drug level monitoring: Secondary | ICD-10-CM

## 2017-12-29 DIAGNOSIS — E785 Hyperlipidemia, unspecified: Secondary | ICD-10-CM

## 2017-12-29 MED ORDER — OMEGA-3-ACID ETHYL ESTERS 1 G PO CAPS: 2.0000 g | ORAL_CAPSULE | Freq: Two times a day (BID) | ORAL | 12 refills | Status: DC

## 2017-12-29 MED ORDER — NYSTATIN 100000 UNIT/GM EX CREA: 1.0000 "application " | TOPICAL_CREAM | Freq: Two times a day (BID) | CUTANEOUS | 2 refills | Status: DC

## 2017-12-29 MED ORDER — SIMVASTATIN 40 MG PO TABS: ORAL_TABLET | ORAL | 6 refills | Status: DC

## 2017-12-29 MED ORDER — SULFAMETHOXAZOLE-TRIMETHOPRIM 800-160 MG PO TABS: 1.0000 | ORAL_TABLET | Freq: Two times a day (BID) | ORAL | 0 refills | Status: DC

## 2017-12-29 MED ORDER — SITAGLIPTIN PHOSPHATE 100 MG PO TABS: 100.0000 mg | ORAL_TABLET | Freq: Every day | ORAL | 6 refills | Status: DC

## 2017-12-29 MED ORDER — METFORMIN HCL ER 500 MG PO TB24: 2000.0000 mg | ORAL_TABLET | Freq: Every day | ORAL | 6 refills | Status: DC
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
## 2017-12-29 NOTE — Assessment & Plan Note (Signed)
Check labs today; encouraged limiting sugary drinks and white bread and pasta and rice and potatoes; work on weight loss

## 2017-12-29 NOTE — Assessment & Plan Note (Signed)
Out of morbidly obese category, explained to patient, reducing risk of complications by reducing BMI; limit portions, increase activity, adequate hydration; try to lose 1 pound a week over the next 12 weeks

## 2017-12-29 NOTE — Assessment & Plan Note (Signed)
Check liver and kidneys 

## 2017-12-29 NOTE — Assessment & Plan Note (Signed)
Start nystatin cream; related to his uncontrolled sugars; avoid SGLT-2 i

## 2017-12-29 NOTE — Patient Instructions (Addendum)
Check out the information at familydoctor.org entitled "Nutrition for Weight Loss: What You Need to Know about Fad Diets" Try to lose between 1-2 pounds per week by taking in fewer calories and burning off more calories You can succeed by limiting portions, limiting foods dense in calories and fat, becoming more active, and drinking 8 glasses of water a day (64 ounces) Don't skip meals, especially breakfast, as skipping meals may alter your metabolism Do not use over-the-counter weight loss pills or gimmicks that claim rapid weight loss A healthy BMI (or body mass index) is between 18.5 and 24.9 You can calculate your ideal BMI at the [place] website ClubMonetize.fr  Try to use PLAIN allergy medicine without the decongestant Avoid: phenylephrine, phenylpropanolamine, and pseudoephredine   Obesity, Adult Obesity is the condition of having too much total body fat. Being overweight or obese means that your weight is greater than what is considered healthy for your body size. Obesity is determined by a measurement called BMI. BMI is an estimate of body fat and is calculated from height and weight. For adults, a BMI of 30 or higher is considered obese. Obesity can eventually lead to other health concerns and major illnesses, including:  Stroke.  Coronary artery disease (CAD).  Type 2 diabetes.  Some types of cancer, including cancers of the colon, breast, uterus, and gallbladder.  Osteoarthritis.  High blood pressure (hypertension).  High cholesterol.  Sleep apnea.  Gallbladder stones.  Infertility problems.  What are the causes? The main cause of obesity is taking in (consuming) more calories than your body uses for energy. Other factors that contribute to this condition may include:  Being born with genes that make you more likely to become obese.  Having a medical condition that causes obesity. These conditions  include: ? Hypothyroidism. ? Polycystic ovarian syndrome (PCOS). ? Binge-eating disorder. ? Cushing syndrome.  Taking certain medicines, such as steroids, antidepressants, and seizure medicines.  Not being physically active (sedentary lifestyle).  Living where there are limited places to exercise safely or buy healthy foods.  Not getting enough sleep.  What increases the risk? The following factors may increase your risk of this condition:  Having a family history of obesity.  Being a woman of African-American descent.  Being a man of Hispanic descent.  What are the signs or symptoms? Having excessive body fat is the main symptom of this condition. How is this diagnosed? This condition may be diagnosed based on:  Your symptoms.  Your medical history.  A physical exam. Your health care provider may measure: ? Your BMI. If you are an adult with a BMI between 25 and less than 30, you are considered overweight. If you are an adult with a BMI of 30 or higher, you are considered obese. ? The distances around your hips and your waist (circumferences). These may be compared to each other to help diagnose your condition. ? Your skinfold thickness. Your health care provider may gently pinch a fold of your skin and measure it.  How is this treated? Treatment for this condition often includes changing your lifestyle. Treatment may include some or all of the following:  Dietary changes. Work with your health care provider and a dietitian to set a weight-loss goal that is healthy and reasonable for you. Dietary changes may include eating: ? Smaller portions. A portion size is the amount of a particular food that is healthy for you to eat at one time. This varies from person to person. ? Low-calorie or  low-fat options. ? More whole grains, fruits, and vegetables.  Regular physical activity. This may include aerobic activity (cardio) and strength training.  Medicine to help you lose  weight. Your health care provider may prescribe medicine if you are unable to lose 1 pound a week after 6 weeks of eating more healthily and doing more physical activity.  Surgery. Surgical options may include gastric banding and gastric bypass. Surgery may be done if: ? Other treatments have not helped to improve your condition. ? You have a BMI of 40 or higher. ? You have life-threatening health problems related to obesity.  Follow these instructions at home:  Eating and drinking   Follow recommendations from your health care provider about what you eat and drink. Your health care provider may advise you to: ? Limit fast foods, sweets, and processed snack foods. ? Choose low-fat options, such as low-fat milk instead of whole milk. ? Eat 5 or more servings of fruits or vegetables every day. ? Eat at home more often. This gives you more control over what you eat. ? Choose healthy foods when you eat out. ? Learn what a healthy portion size is. ? Keep low-fat snacks on hand. ? Avoid sugary drinks, such as soda, fruit juice, iced tea sweetened with sugar, and flavored milk. ? Eat a healthy breakfast.  Drink enough water to keep your urine clear or pale yellow.  Do not go without eating for long periods of time (do not fast) or follow a fad diet. Fasting and fad diets can be unhealthy and even dangerous. Physical Activity  Exercise regularly, as told by your health care provider. Ask your health care provider what types of exercise are safe for you and how often you should exercise.  Warm up and stretch before being active.  Cool down and stretch after being active.  Rest between periods of activity. Lifestyle  Limit the time that you spend in front of your TV, computer, or video game system.  Find ways to reward yourself that do not involve food.  Limit alcohol intake to no more than 1 drink a day for nonpregnant women and 2 drinks a day for men. One drink equals 12 oz of beer,  5 oz of wine, or 1 oz of hard liquor. General instructions  Keep a weight loss journal to keep track of the food you eat and how much you exercise you get.  Take over-the-counter and prescription medicines only as told by your health care provider.  Take vitamins and supplements only as told by your health care provider.  Consider joining a support group. Your health care provider may be able to recommend a support group.  Keep all follow-up visits as told by your health care provider. This is important. Contact a health care provider if:  You are unable to meet your weight loss goal after 6 weeks of dietary and lifestyle changes. This information is not intended to replace advice given to you by your health care provider. Make sure you discuss any questions you have with your health care provider. Document Released: 09/10/2004 Document Revised: 01/06/2016 Document Reviewed: 05/22/2015 Elsevier Interactive Patient Education  2018 [place].  Preventing Unhealthy [employer], Adult Staying at a healthy weight is important. When fat builds up in your body, you may become overweight or obese. These conditions put you at greater risk for developing certain health problems, such as heart disease, diabetes, sleeping problems, joint problems, and some cancers. Unhealthy weight gain is often the  result of making unhealthy choices in what you eat. It is also a result of not getting enough exercise. You can make changes to your lifestyle to prevent obesity and stay as healthy as possible. What nutrition changes can be made? To maintain a healthy weight and prevent obesity:  Eat only as much as your body needs. To do this: ? Pay attention to signs that you are hungry or full. Stop eating as soon as you feel full. ? If you feel hungry, try drinking water first. Drink enough water so your urine is clear or pale yellow. ? Eat smaller portions. ? Look at serving sizes on food labels. Most foods  contain more than one serving per container. ? Eat the recommended amount of calories for your gender and activity level. While most active people should eat around 2,000 calories per day, if you are trying to lose weight or are not very active, you main need to eat less calories. Talk to your health care provider or dietitian about how many calories you should eat each day.  Choose healthy foods, such as: ? Fruits and vegetables. Try to fill at least half of your plate at each meal with fruits and vegetables. ? Whole grains, such as whole wheat bread, brown rice, and quinoa. ? Lean meats, such as chicken or fish. ? Other healthy proteins, such as beans, eggs, or tofu. ? Healthy fats, such as nuts, seeds, fatty fish, and olive oil. ? Low-fat or fat-free dairy.  Check food labels and avoid food and drinks that: ? Are high in calories. ? Have added sugar. ? Are high in sodium. ? Have saturated fats or trans fats.  Limit how much you eat of the following foods: ? Prepackaged meals. ? Fast food. ? Fried foods. ? Processed meat, such as bacon, sausage, and deli meats. ? Fatty cuts of red meat and poultry with skin.  Cook foods in healthier ways, such as by baking, broiling, or grilling.  When grocery shopping, try to shop around the outside of the store. This helps you buy mostly fresh foods and avoid canned and prepackaged foods.  What lifestyle changes can be made?  Exercise at least 30 minutes 5 or more days each week. Exercising includes brisk walking, yard work, biking, running, swimming, and team sports like basketball and soccer. Ask your health care provider which exercises are safe for you.  Do not use any products that contain nicotine or tobacco, such as cigarettes and e-cigarettes. If you need help quitting, ask your health care provider.  Limit alcohol intake to no more than 1 drink a day for nonpregnant women and 2 drinks a day for men. One drink equals 12 oz of beer, 5 oz  of wine, or 1 oz of hard liquor.  Try to get 7-9 hours of sleep each night. What other changes can be made?  Keep a food and activity journal to keep track of: ? What you ate and how many calories you had. Remember to count sauces, dressings, and side dishes. ? Whether you were active, and what exercises you did. ? Your calorie, weight, and activity goals.  Check your weight regularly. Track any changes. If you notice you have gained weight, make changes to your diet or activity routine.  Avoid taking weight-loss medicines or supplements. Talk to your health care provider before starting any new medicine or supplement.  Talk to your health care provider before trying any new diet or exercise plan. Why are these changes  important? Eating healthy, staying active, and having healthy habits not only help prevent obesity, they also:  Help you to manage stress and emotions.  Help you to connect with friends and family.  Improve your self-esteem.  Improve your sleep.  Prevent long-term health problems.  What can happen if changes are not made? Being obese or overweight can cause you to develop joint or bone problems, which can make it hard for you to stay active or do activities you enjoy. Being obese or overweight also puts stress on your heart and lungs and can lead to health problems like diabetes, heart disease, and some cancers. Where to find more information: Talk with your health care provider or a dietitian about healthy eating and healthy lifestyle choices. You may also find other information through these resources:  U.S. Department of Agriculture MyPlate: FormerBoss.no  American Heart Association: www.heart.org  Centers for Disease Control and Prevention: http://www.wolf.info/  Summary  Staying at a healthy weight is important. It helps prevent certain diseases and health problems, such as heart disease, diabetes, joint problems, sleep disorders, and some cancers.  Being  obese or overweight can cause you to develop joint or bone problems, which can make it hard for you to stay active or do activities you enjoy.  You can prevent unhealthy weight gain by eating a healthy diet, exercising regularly, not smoking, limiting alcohol, and getting enough sleep.  Talk with your health care provider or a dietitian for guidance about healthy eating and healthy lifestyle choices. This information is not intended to replace advice given to you by your health care provider. Make sure you discuss any questions you have with your health care provider. Document Released: 08/04/2016 Document Revised: 09/09/2016 Document Reviewed: 09/09/2016 Elsevier Interactive Patient Education  [employer].

## 2017-12-29 NOTE — Assessment & Plan Note (Signed)
Check labs; work on reducing on saturated fats

## 2017-12-29 NOTE — Assessment & Plan Note (Signed)
Antihistamine switching; nasal spray; already saw allergist

## 2018-01-06 LAB — COMPLETE METABOLIC PANEL WITH GFR
AG RATIO: 1.3 (calc) (ref 1.0–2.5)
ALT: 11 U/L (ref 9–46)
AST: 10 U/L (ref 10–40)
Albumin: 4.1 g/dL (ref 3.6–5.1)
Alkaline phosphatase (APISO): 78 U/L (ref 40–115)
BUN: 14 mg/dL (ref 7–25)
CALCIUM: 9.5 mg/dL (ref 8.6–10.3)
CO2: 25 mmol/L (ref 20–32)
Chloride: 103 mmol/L (ref 98–110)
Creat: 0.93 mg/dL (ref 0.60–1.35)
GFR, EST AFRICAN AMERICAN: 116 mL/min/{1.73_m2} (ref >=60)
GFR, EST NON AFRICAN AMERICAN: 100 mL/min/{1.73_m2} (ref >=60)
GLOBULIN: 3.1 g/dL (ref 1.9–3.7)
Glucose, Bld: 325 mg/dL — ABNORMAL HIGH (ref 65–99)
POTASSIUM: 4.9 mmol/L (ref 3.5–5.3)
SODIUM: 136 mmol/L (ref 135–146)
TOTAL PROTEIN: 7.2 g/dL (ref 6.1–8.1)
Total Bilirubin: 0.7 mg/dL (ref 0.2–1.2)

## 2018-01-06 LAB — LIPID PANEL
CHOLESTEROL: 278 mg/dL — AB (ref <200)
HDL: 36 mg/dL — AB (ref >40)
Non-HDL Cholesterol (Calc): 242 mg/dL (calc) — ABNORMAL HIGH (ref <130)
Total CHOL/HDL Ratio: 7.7 (calc) — ABNORMAL HIGH (ref <5.0)
Triglycerides: 421 mg/dL — ABNORMAL HIGH (ref <150)

## 2018-01-06 LAB — MICROALBUMIN / CREATININE URINE RATIO
Creatinine, Urine: 75 mg/dL (ref 20–320)
Microalb Creat Ratio: 9 mcg/mg creat (ref <30)
Microalb, Ur: 0.7 mg/dL

## 2018-01-06 LAB — HEMOGLOBIN A1C
HEMOGLOBIN A1C: 13.2 %{Hb} — AB (ref <5.7)
MEAN PLASMA GLUCOSE: 332 (calc)
eAG (mmol/L): 18.4 (calc)

## 2018-01-09 ENCOUNTER — Encounter: Payer: Self-pay | Admitting: Family Medicine

## 2018-01-11 MED ORDER — PIOGLITAZONE HCL 15 MG PO TABS: 15.0000 mg | ORAL_TABLET | Freq: Every day | ORAL | 5 refills | Status: DC

## 2018-01-31 ENCOUNTER — Other Ambulatory Visit: Payer: Self-pay

## 2018-01-31 ENCOUNTER — Encounter: Payer: Self-pay | Admitting: Emergency Medicine

## 2018-01-31 ENCOUNTER — Ambulatory Visit
Admission: EM | Admit: 2018-01-31 | Discharge: 2018-01-31 | Disposition: A | Discharge status: Home / Self Care | Payer: BLUE CROSS/BLUE SHIELD | Attending: Family Medicine | Admitting: Family Medicine

## 2018-01-31 DIAGNOSIS — L0291 Cutaneous abscess, unspecified: Secondary | ICD-10-CM | POA: Diagnosis not present

## 2018-01-31 MED ORDER — DOXYCYCLINE HYCLATE 100 MG PO CAPS: 100.0000 mg | ORAL_CAPSULE | Freq: Two times a day (BID) | ORAL | 0 refills | Status: DC

## 2018-01-31 NOTE — ED Provider Notes (Signed)
[facility]    CSN: [id number] Arrival date & time: 01/31/18  0804  History   Chief Complaint Chief Complaint  Patient presents with  . Cellulitis   HPI  42 year old male presents with concern for abscess.  Patient states this happens to him frequently.  He states that he has an area of concern on the back of his head.  He states that it started out as a "ingrown hair".  He states that he has had a long-standing history of frequent abscesses.  Began on Wednesday or Thursday.  He states that it "popped".  He states that it is now red, swollen, and getting larger.  No fever.  No known exacerbating or relieving factors.  No other reported symptoms.  No other complaints.  Past Medical History:  Diagnosis Date  . Bulge of cervical disc without myelopathy    between c3-c4  . Diabetes mellitus without complication ([place])   . Folliculitis   . Hyperlipidemia   . Low testosterone   . Obesity   . Psoriasis   . Tarsal tunnel syndrome     Patient Active Problem List   Diagnosis Date Noted  . DM (diabetes mellitus), type 2, uncontrolled ([place]) 12/29/2017  . Candidiasis 12/29/2017  . Obesity (BMI 30.0-34.9) 12/29/2017  . Medication monitoring encounter 09/04/2015  . Low testosterone 02/12/2015  . Allergic dermatitis 02/12/2015  . Dyslipidemia 02/12/2015  . Allergic rhinitis 02/12/2015   Past Surgical History:  Procedure Laterality Date  . ANKLE SURGERY Bilateral    tarsal tunnel    Home Medications    Prior to Admission medications   Medication Sig Start Date End Date Taking? Authorizing Provider  aspirin EC 81 MG tablet Take 81 mg by mouth daily.   Yes [provider]  fexofenadine (ALLEGRA) 180 MG tablet Take 180 mg by mouth 2 (two) times daily.   Yes [provider]  metFORMIN (GLUCOPHAGE-XR) 500 MG 24 hr tablet Take 4 tablets (2,000 mg total) by mouth daily. 12/29/17  Yes Lada, Satira Anis, MD  nystatin cream (MYCOSTATIN) Apply 1 application  topically 2 (two) times daily. 12/29/17  Yes Lada, Satira Anis, MD  omega-3 acid ethyl esters (LOVAZA) 1 g capsule Take 2 capsules (2 g total) by mouth 2 (two) times daily. 12/29/17  Yes Lada, Satira Anis, MD  pioglitazone (ACTOS) 15 MG tablet Take 1 tablet (15 mg total) by mouth daily. 01/11/18  Yes Lada, Satira Anis, MD  ranitidine (ZANTAC) 150 MG tablet Take 1 tablet (150 mg total) 2 (two) times daily by mouth. 06/24/17  Yes Lada, Satira Anis, MD  simvastatin (ZOCOR) 40 MG tablet TAKE 1 TABLET(40 MG) BY MOUTH AT BEDTIME 12/29/17  Yes Lada, Satira Anis, MD  sitaGLIPtin (JANUVIA) 100 MG tablet Take 1 tablet (100 mg total) by mouth daily. 12/29/17  Yes Lada, Satira Anis, MD  Blood Glucose Monitoring Suppl ([place]) W/DEVICE KIT Patient asked for VERIFLEX if that exists; staff found VERIO; please correct if that type exists; dx uncontrolled type 2 DM 02/13/15   Lada, Satira Anis, MD  doxycycline (VIBRAMYCIN) 100 MG capsule Take 1 capsule (100 mg total) by mouth 2 (two) times daily. 01/31/18   Coral Spikes, DO  glucose blood (ONETOUCH VERIO) test strip Check fingerstick blood sugars once a day on average; uncontrolled type 2 dm with hyperglycemia 02/13/15   Lada, Satira Anis, MD  [facility] DELICA LANCETS FINE MISC Check fingerstick blood sugars once a day on average; uncontrolled type 2 dm with  hyperglycemia Patient not taking: Reported on 12/29/2017 02/13/15   Arnetha Courser, MD  triamcinolone cream (KENALOG) 0.1 % Apply 1 application topically 2 (two) times daily as needed. Patient not taking: Reported on 06/22/2017 02/12/15   Arnetha Courser, MD    Family History Family History  Problem Relation Age of Onset  . Hypertension Mother   . Hyperlipidemia Mother   . Kidney disease Mother   . Diabetes Father   . Hypertension Father   . Hyperlipidemia Father   . Cancer Maternal Grandmother        not sure what kind  . Cancer Paternal Grandmother        lung?  . Stroke Paternal Grandmother   . Heart  disease Paternal Grandfather        heart failure    Social History Social History   Tobacco Use  . Smoking status: Never Smoker  . Smokeless tobacco: Never Used  Substance Use Topics  . Alcohol use: Yes    Comment: occasional  . Drug use: No     Allergies   Invokana [canagliflozin]   Review of Systems Review of Systems  Constitutional: Negative.   Skin:       Abscess.   Physical Exam Triage Vital Signs ED Triage Vitals [01/31/18 0819]  Enc Vitals Group     BP 110/83     Pulse Rate 95     Resp 16     Temp 98.3 F (36.8 C)     Temp Source Oral     SpO2 96 %     Weight 270 lb (122.5 kg)     Height 6' 2"  (1.88 m)     Head Circumference      Peak Flow      Pain Score 8     Pain Loc      Pain Edu?      Excl. in [place]?    Updated Vital Signs BP 110/83 (BP Location: Left Arm)   Pulse 95   Temp 98.3 F (36.8 C) (Oral)   Resp 16   Ht 6' 2"  (1.88 m)   Wt 270 lb (122.5 kg)   SpO2 96%   BMI 34.67 kg/m  Physical Exam  Constitutional: He is oriented to person, place, and time. He appears well-developed. No distress.  Cardiovascular: Normal rate and regular rhythm.  Pulmonary/Chest: Effort normal and breath sounds normal. He has no wheezes. He has no rales.  Neurological: He is alert and oriented to person, place, and time.  Skin:  Occipital region (scalp) with a 7 cm x 7 cm area of redness and induration.  Tender to palpation.  No fluctuance.  Psychiatric: He has a normal mood and affect. His behavior is normal.  Nursing note and vitals reviewed.  UC Treatments / Results  Labs (all labs ordered are listed, but only abnormal results are displayed) Labs Reviewed - No data to display  EKG None  Radiology No results found.  Procedures Procedures (including critical care time)  Medications Ordered in UC Medications - No data to display  Initial Impression / Assessment and Plan / UC Course  I have reviewed the triage vital signs and the nursing  notes.  Pertinent labs & imaging results that were available during my care of the patient were reviewed by me and considered in my medical decision making (see chart for details).    42 year old male presents with abscess.  Not amenable to incision and drainage as there is no fluctuance.  Placing on doxycycline.  Advised to get reevaluated if he fails to improve or worsens.  Final Clinical Impressions(s) / UC Diagnoses   Final diagnoses:  Abscess     Discharge Instructions     Warm compresses.  Antibiotic as prescribed.  If it worsens, please see [place] or your PCP.  Take care  Dr. Lacinda Axon    ED Prescriptions    Medication Sig Dispense Auth. Provider   doxycycline (VIBRAMYCIN) 100 MG capsule Take 1 capsule (100 mg total) by mouth 2 (two) times daily. 20 capsule Coral Spikes, DO     Controlled Substance Prescriptions [place] Controlled Substance Registry consulted? Not Applicable   Coral Spikes, [place] 01/31/18 [phone]

## 2018-01-31 NOTE — ED Triage Notes (Signed)
Patient in today c/o infection on the back of his head x 5 days and under right arm. 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

## 2018-01-31 NOTE — Discharge Instructions (Signed)
Warm compresses.  Antibiotic as prescribed.  If it worsens, please see [place] or your PCP.  Take care  Dr. Lacinda Axon

## 2018-02-02 ENCOUNTER — Encounter: Payer: Self-pay | Admitting: Family Medicine

## 2018-04-05 ENCOUNTER — Ambulatory Visit: Payer: BLUE CROSS/BLUE SHIELD | Admitting: Family Medicine

## 2018-04-05 ENCOUNTER — Encounter: Payer: Self-pay | Admitting: Family Medicine

## 2018-04-05 VITALS — BP 124/76 | HR 96 | Temp 98.1°F | Ht 73.0 in | Wt 276.9 lb

## 2018-04-05 DIAGNOSIS — E785 Hyperlipidemia, unspecified: Secondary | ICD-10-CM | POA: Diagnosis not present

## 2018-04-05 DIAGNOSIS — E1165 Type 2 diabetes mellitus with hyperglycemia: Secondary | ICD-10-CM

## 2018-04-05 DIAGNOSIS — Z5181 Encounter for therapeutic drug level monitoring: Secondary | ICD-10-CM

## 2018-04-05 DIAGNOSIS — J029 Acute pharyngitis, unspecified: Secondary | ICD-10-CM | POA: Diagnosis not present

## 2018-04-05 DIAGNOSIS — L0293 Carbuncle, unspecified: Secondary | ICD-10-CM

## 2018-04-05 LAB — POCT RAPID STREP A (OFFICE): RAPID STREP A SCREEN: NEGATIVE

## 2018-04-05 MED ORDER — SULFAMETHOXAZOLE-TRIMETHOPRIM 800-160 MG PO TABS: 1.0000 | ORAL_TABLET | Freq: Two times a day (BID) | ORAL | 0 refills | Status: DC

## 2018-04-05 NOTE — Assessment & Plan Note (Signed)
BMI > 35 with comorbid DM, high cholesterol

## 2018-04-05 NOTE — Patient Instructions (Addendum)
Check out the information at familydoctor.org entitled "Nutrition for Weight Loss: What You Need to Know about Fad Diets" Try to lose between 1-2 pounds per week by taking in fewer calories and burning off more calories You can succeed by limiting portions, limiting foods dense in calories and fat, becoming more active, and drinking 8 glasses of water a day (64 ounces) Don't skip meals, especially breakfast, as skipping meals may alter your metabolism Do not use over-the-counter weight loss pills or gimmicks that claim rapid weight loss A healthy BMI (or body mass index) is between 18.5 and 24.9 You can calculate your ideal BMI at the [place] website ClubMonetize.fr  Please do see your eye doctor regularly, and have your eyes examined every year (or more often per his or her recommendation) Check your feet every night and let me know right away of any sores, infections, numbness, etc. Try to limit sweets, white bread, white rice, white potatoes It is okay with me for you to not check your fingerstick blood sugars (per [employer] of Endocrinology Best Practices), unless you are interested and feel it would be helpful for you  Let me know if your voice issue does not clear up quickly Start the antibiotics Please do eat yogurt or kimchi or take a probiotic daily for the next month We want to replace the healthy germs in the gut If you notice foul, watery diarrhea in the next two months, schedule an appointment RIGHT AWAY or go to an urgent care or the emergency room if a holiday or over a weekend  Diabetes Mellitus and Nutrition When you have diabetes (diabetes mellitus), it is very important to have healthy eating habits because your blood sugar (glucose) levels are greatly affected by what you eat and drink. Eating healthy foods in the appropriate amounts, at about the same times every day, can help you:  Control your blood glucose.  Lower  your risk of heart disease.  Improve your blood pressure.  Reach or maintain a healthy weight.  Every person with diabetes is different, and each person has different needs for a meal plan. Your health care provider may recommend that you work with a diet and nutrition specialist (dietitian) to make a meal plan that is best for you. Your meal plan may vary depending on factors such as:  The calories you need.  The medicines you take.  Your weight.  Your blood glucose, blood pressure, and cholesterol levels.  Your activity level.  Other health conditions you have, such as heart or kidney disease.  How do carbohydrates affect me? Carbohydrates affect your blood glucose level more than any other type of food. Eating carbohydrates naturally increases the amount of glucose in your blood. Carbohydrate counting is a method for keeping track of how many carbohydrates you eat. Counting carbohydrates is important to keep your blood glucose at a healthy level, especially if you use insulin or take certain oral diabetes medicines. It is important to know how many carbohydrates you can safely have in each meal. This is different for every person. Your dietitian can help you calculate how many carbohydrates you should have at each meal and for snack. Foods that contain carbohydrates include:  Bread, cereal, rice, pasta, and crackers.  Potatoes and corn.  Peas, beans, and lentils.  Milk and yogurt.  Fruit and juice.  Desserts, such as cakes, cookies, ice cream, and candy.  How does alcohol affect me? Alcohol can cause a sudden decrease in blood glucose (hypoglycemia), especially  if you use insulin or take certain oral diabetes medicines. Hypoglycemia can be a life-threatening condition. Symptoms of hypoglycemia (sleepiness, dizziness, and confusion) are similar to symptoms of having too much alcohol. If your health care provider says that alcohol is safe for you, follow these  guidelines:  Limit alcohol intake to no more than 1 drink per day for nonpregnant women and 2 drinks per day for men. One drink equals 12 oz of beer, 5 oz of wine, or 1 oz of hard liquor.  Do not drink on an empty stomach.  Keep yourself hydrated with water, diet soda, or unsweetened iced tea.  Keep in mind that regular soda, juice, and other mixers may contain a lot of sugar and must be counted as carbohydrates.  What are tips for following this plan? Reading food labels  Start by checking the serving size on the label. The amount of calories, carbohydrates, fats, and other nutrients listed on the label are based on one serving of the food. Many foods contain more than one serving per package.  Check the total grams (g) of carbohydrates in one serving. You can calculate the number of servings of carbohydrates in one serving by dividing the total carbohydrates by 15. For example, if a food has 30 g of total carbohydrates, it would be equal to 2 servings of carbohydrates.  Check the number of grams (g) of saturated and trans fats in one serving. Choose foods that have low or no amount of these fats.  Check the number of milligrams (mg) of sodium in one serving. Most people should limit total sodium intake to less than 2,300 mg per day.  Always check the nutrition information of foods labeled as "low-fat" or "nonfat". These foods may be higher in added sugar or refined carbohydrates and should be avoided.  Talk to your dietitian to identify your daily goals for nutrients listed on the label. Shopping  Avoid buying canned, premade, or processed foods. These foods tend to be high in fat, sodium, and added sugar.  Shop around the outside edge of the grocery store. This includes fresh fruits and vegetables, bulk grains, fresh meats, and fresh dairy. Cooking  Use low-heat cooking methods, such as baking, instead of high-heat cooking methods like deep frying.  Cook using healthy oils, such  as olive, canola, or sunflower oil.  Avoid cooking with butter, cream, or high-fat meats. Meal planning  Eat meals and snacks regularly, preferably at the same times every day. Avoid going long periods of time without eating.  Eat foods high in fiber, such as fresh fruits, vegetables, beans, and whole grains. Talk to your dietitian about how many servings of carbohydrates you can eat at each meal.  Eat 4-6 ounces of lean protein each day, such as lean meat, chicken, fish, eggs, or tofu. 1 ounce is equal to 1 ounce of meat, chicken, or fish, 1 egg, or 1/4 cup of tofu.  Eat some foods each day that contain healthy fats, such as avocado, nuts, seeds, and fish. Lifestyle   Check your blood glucose regularly.  Exercise at least 30 minutes 5 or more days each week, or as told by your health care provider.  Take medicines as told by your health care provider.  Do not use any products that contain nicotine or tobacco, such as cigarettes and e-cigarettes. If you need help quitting, ask your health care provider.  Work with a Social worker or diabetes educator to identify strategies to manage stress and any emotional and  social challenges. What are some questions to ask my health care provider?  Do I need to meet with a diabetes educator?  Do I need to meet with a dietitian?  What number can I call if I have questions?  When are the best times to check my blood glucose? Where to find more information:  American Diabetes Association: diabetes.org/food-and-fitness/food  Academy of Nutrition and Dietetics: PokerClues.dk  [employer] of Diabetes and Digestive and Kidney Diseases (NIH): ContactWire.be Summary  A healthy meal plan will help you control your blood glucose and maintain a healthy lifestyle.  Working with a diet and nutrition specialist (dietitian) can  help you make a meal plan that is best for you.  Keep in mind that carbohydrates and alcohol have immediate effects on your blood glucose levels. It is important to count carbohydrates and to use alcohol carefully. This information is not intended to replace advice given to you by your health care provider. Make sure you discuss any questions you have with your health care provider. Document Released: 04/30/2005 Document Revised: 09/07/2016 Document Reviewed: 09/07/2016 Elsevier Interactive Patient Education  2018 [place].  Sleeve Gastrectomy, Care After Refer to this sheet in the next few weeks. These instructions provide you with information about caring for yourself after your procedure. Your health care provider may also give you more specific instructions. Your treatment has been planned according to current medical practices, but problems sometimes occur. Call your health care provider if you have any problems or questions after your procedure. What can I expect after the procedure? After the procedure, it is common to have:  Pain in your abdomen.  Decreased appetite.  Clear fluid leaking through the small tube (drain) that comes from your incision site.  Follow these instructions at home: Medicines  Take over-the-counter and prescription medicines only as told by your health care provider.  Do not drive for 24 hours if you received a sedative.  Do not drive or operate heavy machinery while taking prescription pain medicine. Incision and drain care   Follow instructions from your health care provider about how to take care of your incisions. Make sure you: ? Wash your hands with soap and water before you change your bandage (dressing). If soap and water are not available, use hand sanitizer. ? Change your dressing as told by your health care provider. ? Leave stitches (sutures), skin glue, or adhesive strips in place. These skin closures may need to be in place for 2 weeks  or longer. If adhesive strip edges start to loosen and curl up, you may trim the loose edges. Do not remove adhesive strips completely unless your health care provider tells you to do that.  Keep the area around your incisions and your drain clean and dry.  Check your incision areas every day for signs of infection. Check for: ? More redness, swelling, or pain. ? More fluid or blood. ? Warmth. ? Pus or a bad smell.  Empty your drain every day. Follow instructions from your health care provider about recording the amount of fluid that comes from your drain. Make note of any changes in the amount or appearance of the fluid. Activity  Return to your normal activities as told by your health care provider. Ask your health care provider what activities are safe for you.  Do not lift anything that is heavier than 10 lb (4.5 kg).  Avoid intense physical activity for as long as told by your health care provider.  Move around  at least once per day, every day. As you start to feel better, you may start to exercise more. Eating and drinking  Follow instructions from your health care provider about eating or drinking restrictions. You will be given instructions about the type, the size, and the timing of your meals. ? Keep track of any foods that cause discomfort, such as bloating or cramping. ? Eat healthy foods. Avoid foods that are high in fat or sugar.  Stop eating when you feel full.  Take supplements only as told by your health care provider.  Drink enough fluid to keep your urine clear or pale yellow. General instructions  Do not take baths, swim, or use a hot tub until your health care provider approves. Ask your health care provider if you can take showers. You may only be allowed to take sponge baths for bathing.  Do not use tobacco products, including cigarettes, chewing tobacco, or e-cigarettes. If you need help quitting, ask your health care provider.  Wear compression stockings  as told by your health care provider. These stockings help to prevent blood clots and reduce swelling in your legs.  Do breathing exercises as told by your health care provider.  Keep all follow-up visits as told by your health care provider. This is important. Contact a health care provider if:  You have pain that gets worse or does not get better with medicine.  You have more redness, swelling, or pain around your incisions.  You have more fluid or blood coming from your incisions.  Your incisions feel warm to the touch.  You have pus or a bad smell coming from your incisions.  You have a fever or chills.  You have problems with your drain.  You have green or bad-smelling fluid leaking from your drain. Get help right away if:  You have difficulty breathing.  You have severe pain, especially in your legs. This information is not intended to replace advice given to you by your health care provider. Make sure you discuss any questions you have with your health care provider. Document Released: 05/30/2009 Document Revised: 03/29/2016 Document Reviewed: 01/25/2015 Elsevier Interactive Patient Education  2018 [employer].  Preventing Unhealthy [employer], Adult Staying at a healthy weight is important. When fat builds up in your body, you may become overweight or obese. These conditions put you at greater risk for developing certain health problems, such as heart disease, diabetes, sleeping problems, joint problems, and some cancers. Unhealthy weight gain is often the result of making unhealthy choices in what you eat. It is also a result of not getting enough exercise. You can make changes to your lifestyle to prevent obesity and stay as healthy as possible. What nutrition changes can be made? To maintain a healthy weight and prevent obesity:  Eat only as much as your body needs. To do this: ? Pay attention to signs that you are hungry or full. Stop eating as soon as you feel  full. ? If you feel hungry, try drinking water first. Drink enough water so your urine is clear or pale yellow. ? Eat smaller portions. ? Look at serving sizes on food labels. Most foods contain more than one serving per container. ? Eat the recommended amount of calories for your gender and activity level. While most active people should eat around 2,000 calories per day, if you are trying to lose weight or are not very active, you main need to eat less calories. Talk to your health care provider  or dietitian about how many calories you should eat each day.  Choose healthy foods, such as: ? Fruits and vegetables. Try to fill at least half of your plate at each meal with fruits and vegetables. ? Whole grains, such as whole wheat bread, brown rice, and quinoa. ? Lean meats, such as chicken or fish. ? Other healthy proteins, such as beans, eggs, or tofu. ? Healthy fats, such as nuts, seeds, fatty fish, and olive oil. ? Low-fat or fat-free dairy.  Check food labels and avoid food and drinks that: ? Are high in calories. ? Have added sugar. ? Are high in sodium. ? Have saturated fats or trans fats.  Limit how much you eat of the following foods: ? Prepackaged meals. ? Fast food. ? Fried foods. ? Processed meat, such as bacon, sausage, and deli meats. ? Fatty cuts of red meat and poultry with skin.  Cook foods in healthier ways, such as by baking, broiling, or grilling.  When grocery shopping, try to shop around the outside of the store. This helps you buy mostly fresh foods and avoid canned and prepackaged foods.  What lifestyle changes can be made?  Exercise at least 30 minutes 5 or more days each week. Exercising includes brisk walking, yard work, biking, running, swimming, and team sports like basketball and soccer. Ask your health care provider which exercises are safe for you.  Do not use any products that contain nicotine or tobacco, such as cigarettes and e-cigarettes. If you  need help quitting, ask your health care provider.  Limit alcohol intake to no more than 1 drink a day for nonpregnant women and 2 drinks a day for men. One drink equals 12 oz of beer, 5 oz of wine, or 1 oz of hard liquor.  Try to get 7-9 hours of sleep each night. What other changes can be made?  Keep a food and activity journal to keep track of: ? What you ate and how many calories you had. Remember to count sauces, dressings, and side dishes. ? Whether you were active, and what exercises you did. ? Your calorie, weight, and activity goals.  Check your weight regularly. Track any changes. If you notice you have gained weight, make changes to your diet or activity routine.  Avoid taking weight-loss medicines or supplements. Talk to your health care provider before starting any new medicine or supplement.  Talk to your health care provider before trying any new diet or exercise plan. Why are these changes important? Eating healthy, staying active, and having healthy habits not only help prevent obesity, they also:  Help you to manage stress and emotions.  Help you to connect with friends and family.  Improve your self-esteem.  Improve your sleep.  Prevent long-term health problems.  What can happen if changes are not made? Being obese or overweight can cause you to develop joint or bone problems, which can make it hard for you to stay active or do activities you enjoy. Being obese or overweight also puts stress on your heart and lungs and can lead to health problems like diabetes, heart disease, and some cancers. Where to find more information: Talk with your health care provider or a dietitian about healthy eating and healthy lifestyle choices. You may also find other information through these resources:  U.[place]. Department of Agriculture MyPlate: FormerBoss.no  American Heart Association: www.heart.org  Centers for Disease Control and Prevention:  http://www.wolf.info/  Summary  Staying at a healthy weight is important. It helps  prevent certain diseases and health problems, such as heart disease, diabetes, joint problems, sleep disorders, and some cancers.  Being obese or overweight can cause you to develop joint or bone problems, which can make it hard for you to stay active or do activities you enjoy.  You can prevent unhealthy weight gain by eating a healthy diet, exercising regularly, not smoking, limiting alcohol, and getting enough sleep.  Talk with your health care provider or a dietitian for guidance about healthy eating and healthy lifestyle choices. This information is not intended to replace advice given to you by your health care provider. Make sure you discuss any questions you have with your health care provider. Document Released: 08/04/2016 Document Revised: 09/09/2016 Document Reviewed: 09/09/2016 Elsevier Interactive Patient Education  [employer].

## 2018-04-05 NOTE — Assessment & Plan Note (Signed)
Check liver and kidneys 

## 2018-04-05 NOTE — Assessment & Plan Note (Signed)
Check lipids today; limit saturated fats 

## 2018-04-05 NOTE — Progress Notes (Signed)
BP 124/76   Pulse 96   Temp 98.1 F (36.7 C)   Ht 6\' 1"  (1.854 m)   Wt 276 lb 14.4 oz (125.6 kg)   SpO2 95%   BMI 36.53 kg/m    Subjective:    Patient ID: Donald Baxter, male    DOB: 06-11-1976, 42 y.o.   MRN: [id number]  HPI: Donald Baxter is a 42 y.o. male  Chief Complaint  Patient presents with  . Follow-up    HPI Patient is here for f/u  He has had a sore throat; two weeks duration; voice has been hoarse; works in Scientist, research (medical), Press photographer person for [employer]; no fevers, no big swollen nodes; hx of strep and this feels like that he says  He had a bump on the back of the head that was huge, and another on the right chest wall; he has seen a dermatologist; was diagnosed with folliculitis with that  Type 2 diabetes; not the greatest; going by symptoms; dry mouth; no blurred vision; tries to limit sugary drinks  Obesity; he has gained some weight back; not eating any differently; increase in activity; has felt tired; BMs are a little constipated, but not new; 3-4 x week  High cholesterol; taking statin  Depression screen [facility] 2/9 04/05/2018 12/29/2017 06/22/2017 04/06/2017 12/29/2016  Decreased Interest 0 0 0 - 0  Down, Depressed, Hopeless 0 0 0 0 0  PHQ - 2 Score 0 0 0 0 0   Relevant past medical, surgical, family and social history reviewed Past Medical History:  Diagnosis Date  . Bulge of cervical disc without myelopathy    between c3-c4  . Diabetes mellitus without complication ([place])   . Folliculitis   . Hyperlipidemia   . Low testosterone   . Obesity   . Psoriasis   . Tarsal tunnel syndrome    Past Surgical History:  Procedure Laterality Date  . ANKLE SURGERY Bilateral    tarsal tunnel    Family History  Problem Relation Age of Onset  . Hypertension Mother   . Hyperlipidemia Mother   . Kidney disease Mother   . Diabetes Father   . Hypertension Father   . Hyperlipidemia Father   . Cancer Maternal Grandmother        not sure what kind  . Cancer Paternal  Grandmother        lung?  . Stroke Paternal Grandmother   . Heart disease Paternal Grandfather        heart failure   Social History   Tobacco Use  . Smoking status: Never Smoker  . Smokeless tobacco: Never Used  Substance Use Topics  . Alcohol use: Yes    Comment: occasional  . Drug use: No    Interim medical history since last visit reviewed. Allergies and medications reviewed  Review of Systems Per HPI unless specifically indicated above     Objective:    BP 124/76   Pulse 96   Temp 98.1 F (36.7 C)   Ht 6\' 1"  (1.854 m)   Wt 276 lb 14.4 oz (125.6 kg)   SpO2 95%   BMI 36.53 kg/m   Wt Readings from Last 3 Encounters:  04/05/18 276 lb 14.4 oz (125.6 kg)  01/31/18 270 lb (122.5 kg)  12/29/17 263 lb 8 oz (119.5 kg)    Physical Exam  Constitutional: He appears well-developed and well-nourished. No distress.  Weight gain 13+ pounds over the last 3 months; morbidly obese  HENT:  Head: Normocephalic  and atraumatic.  Mouth/Throat: Mucous membranes are normal. No oropharyngeal exudate, posterior oropharyngeal edema or posterior oropharyngeal erythema.  Eyes: EOM are normal. No scleral icterus.  Neck: No thyromegaly present.  Cardiovascular: Normal rate and regular rhythm.  Pulmonary/Chest: Effort normal and breath sounds normal.  Abdominal: Soft. Normal appearance and bowel sounds are normal. He exhibits no distension.  Musculoskeletal: He exhibits no edema.  Lymphadenopathy:    He has cervical adenopathy (shoddy).  Neurological: He is alert.  Skin: Skin is warm and dry. No pallor.  Under the right armpit, several erythematous papules, one with whitish drainage at opening; another firm nodule on the right upper breast; left armpit is unremarkable  Psychiatric: He has a normal mood and affect. His behavior is normal. Judgment and thought content normal.   Diabetic Foot Form - Detailed   Diabetic Foot Exam - detailed Diabetic Foot exam was performed with the  following findings:  Yes 04/05/2018  9:43 AM  Visual Foot Exam completed.:  Yes  Pulse Foot Exam completed.:  Yes  Right Dorsalis Pedis:  Present Left Dorsalis Pedis:  Present  Sensory Foot Exam Completed.:  Yes Semmes-Weinstein Monofilament Test R Site 1-Great Toe:  Pos L Site 1-Great Toe:  Pos         Results for orders placed or performed in visit on 12/29/17  Urine Microalbumin w/creat. ratio  Result Value Ref Range   Creatinine, Urine 75 20 - 320 mg/dL   Microalb, Ur 0.7 mg/dL   Microalb Creat Ratio 9 <30 mcg/mg creat  Hemoglobin A1C  Result Value Ref Range   Hgb A1c MFr Bld 13.2 (H) <5.7 % of total Hgb   Mean Plasma Glucose 332 (calc)   eAG (mmol/L) 18.4 (calc)  Lipid panel  Result Value Ref Range   Cholesterol 278 (H) <200 mg/dL   HDL 36 (L) >40 mg/dL   Triglycerides 421 (H) <150 mg/dL   LDL Cholesterol (Calc)  mg/dL (calc)   Total CHOL/HDL Ratio 7.7 (H) <5.0 (calc)   Non-HDL Cholesterol (Calc) 242 (H) <130 mg/dL (calc)  COMPLETE METABOLIC PANEL WITH GFR  Result Value Ref Range   Glucose, Bld 325 (H) 65 - 99 mg/dL   BUN 14 7 - 25 mg/dL   Creat 0.93 0.60 - 1.35 mg/dL   GFR, Est Non African American 100 > OR = 60 mL/min/1.34m2   GFR, Est African American 116 > OR = 60 mL/min/1.75m2   BUN/Creatinine Ratio NOT APPLICABLE 6 - 22 (calc)   Sodium 136 135 - 146 mmol/L   Potassium 4.9 3.5 - 5.3 mmol/L   Chloride 103 98 - 110 mmol/L   CO2 25 20 - 32 mmol/L   Calcium 9.5 8.6 - 10.3 mg/dL   Total Protein 7.2 6.1 - 8.1 g/dL   Albumin 4.1 3.6 - 5.1 g/dL   Globulin 3.1 1.9 - 3.7 g/dL (calc)   AG Ratio 1.3 1.0 - 2.5 (calc)   Total Bilirubin 0.7 0.2 - 1.2 mg/dL   Alkaline phosphatase (APISO) 78 40 - 115 U/L   AST 10 10 - 40 U/L   ALT 11 9 - 46 U/L      Assessment & Plan:   Problem List Items Addressed This Visit      Endocrine   DM (diabetes mellitus), type 2, uncontrolled (HCC) (Chronic)    Check labs; encouraged weight loss and healthy eating; requesting copy of eye  exam; foot exam by MD; declined refresher course      Relevant Orders  COMPLETE METABOLIC PANEL WITH GFR   Hemoglobin A1c   Lipid panel     Other   Recurrent boils    Start antibiotic; don't pick      Relevant Medications   sulfamethoxazole-trimethoprim (BACTRIM DS) 800-160 MG tablet   Other Relevant Orders   Ambulatory referral to Dermatology   Morbid obesity ([place])    BMI > 35 with comorbid DM, high cholesterol      Medication monitoring encounter    Check liver and kidneys      Relevant Orders   COMPLETE METABOLIC PANEL WITH GFR   Dyslipidemia    Check lipids today; limit saturated fats      Relevant Orders   Lipid panel    Other Visit Diagnoses    Pharyngitis, unspecified etiology    -  Primary   rule out strep, but likely viral; gargle with salt water   Relevant Orders   Culture, Group A Strep   POCT rapid strep A       Follow up plan: Return in about 3 months (around 07/06/2018).  An after-visit summary was printed and given to the patient at [facility].  Please see the patient instructions which may contain other information and recommendations beyond what is mentioned above in the assessment and plan.  Meds ordered this encounter  Medications  . sulfamethoxazole-trimethoprim (BACTRIM DS) 800-160 MG tablet    Sig: Take 1 tablet by mouth 2 (two) times daily.    Dispense:  14 tablet    Refill:  0    Orders Placed This Encounter  Procedures  . Culture, Group A Strep  . COMPLETE METABOLIC PANEL WITH GFR  . Hemoglobin A1c  . Lipid panel  . Ambulatory referral to Dermatology  . POCT rapid strep A

## 2018-04-05 NOTE — Assessment & Plan Note (Addendum)
Check labs; encouraged weight loss and healthy eating; requesting copy of eye exam; foot exam by MD; declined refresher course

## 2018-04-05 NOTE — Assessment & Plan Note (Signed)
Start antibiotic; don't pick

## 2018-04-07 LAB — COMPLETE METABOLIC PANEL WITH GFR
AG Ratio: 1.5 (calc) (ref 1.0–2.5)
ALT: 15 U/L (ref 9–46)
AST: 14 U/L (ref 10–40)
Albumin: 4 g/dL (ref 3.6–5.1)
Alkaline phosphatase (APISO): 57 U/L (ref 40–115)
BILIRUBIN TOTAL: 0.4 mg/dL (ref 0.2–1.2)
BUN: 13 mg/dL (ref 7–25)
CALCIUM: 9.3 mg/dL (ref 8.6–10.3)
CHLORIDE: 102 mmol/L (ref 98–110)
CO2: 26 mmol/L (ref 20–32)
Creat: 1.01 mg/dL (ref 0.60–1.35)
GFR, Est African American: 105 mL/min/{1.73_m2} (ref >=60)
GFR, Est Non African American: 91 mL/min/{1.73_m2} (ref >=60)
GLUCOSE: 221 mg/dL — AB (ref 65–99)
Globulin: 2.7 g/dL (calc) (ref 1.9–3.7)
Potassium: 4.6 mmol/L (ref 3.5–5.3)
Sodium: 137 mmol/L (ref 135–146)
Total Protein: 6.7 g/dL (ref 6.1–8.1)

## 2018-04-07 LAB — LIPID PANEL
Cholesterol: 184 mg/dL (ref <200)
HDL: 32 mg/dL — ABNORMAL LOW (ref >40)
NON-HDL CHOLESTEROL (CALC): 152 mg/dL — AB (ref <130)
TRIGLYCERIDES: 435 mg/dL — AB (ref <150)
Total CHOL/HDL Ratio: 5.8 (calc) — ABNORMAL HIGH (ref <5.0)

## 2018-04-07 LAB — CULTURE, GROUP A STREP
MICRO NUMBER: 90990502
SPECIMEN QUALITY:: ADEQUATE

## 2018-04-07 LAB — HEMOGLOBIN A1C W/OUT EAG: Hgb A1c MFr Bld: 12 % of total Hgb — ABNORMAL HIGH (ref <5.7)

## 2018-04-08 ENCOUNTER — Other Ambulatory Visit: Payer: Self-pay | Admitting: Family Medicine

## 2018-04-08 ENCOUNTER — Encounter: Payer: Self-pay | Admitting: Family Medicine

## 2018-04-08 DIAGNOSIS — E785 Hyperlipidemia, unspecified: Secondary | ICD-10-CM

## 2018-04-08 DIAGNOSIS — E781 Pure hyperglyceridemia: Secondary | ICD-10-CM

## 2018-04-08 DIAGNOSIS — E1165 Type 2 diabetes mellitus with hyperglycemia: Secondary | ICD-10-CM

## 2018-04-08 NOTE — Progress Notes (Signed)
Refer to endo 

## 2018-04-28 MED ORDER — ERTUGLIFLOZIN L-PYROGLUTAMICAC 5 MG PO TABS: 1.0000 | ORAL_TABLET | Freq: Every day | ORAL | 5 refills | Status: DC

## 2018-04-28 MED ORDER — PIOGLITAZONE HCL 30 MG PO TABS: 30.0000 mg | ORAL_TABLET | Freq: Every day | ORAL | 5 refills | Status: DC

## 2018-04-28 MED ORDER — ATORVASTATIN CALCIUM 40 MG PO TABS: 40.0000 mg | ORAL_TABLET | Freq: Every day | ORAL | 2 refills | Status: DC

## 2018-07-06 ENCOUNTER — Ambulatory Visit: Payer: BLUE CROSS/BLUE SHIELD | Admitting: Family Medicine

## 2018-09-14 ENCOUNTER — Other Ambulatory Visit: Payer: Self-pay | Admitting: Family Medicine

## 2018-09-14 NOTE — Telephone Encounter (Signed)
Patient was supposed to be seen on November 20th He canceled; no upcoming appointments Please have him schedule an appointment soon We look forward to seeing him I'm approving 30 days of atorvastatin Please check on the endocrinology referral too

## 2018-09-15 NOTE — Telephone Encounter (Signed)
Please have him schedule an appointment soon

## 2018-09-15 NOTE — Telephone Encounter (Signed)
Left detailed voicemail, as far as endo left message to make sure he gets that setup, looks like they tried to call and schedule him several times.

## 2018-09-16 ENCOUNTER — Other Ambulatory Visit: Payer: Self-pay | Admitting: Family Medicine

## 2018-09-16 MED ORDER — EMPAGLIFLOZIN 10 MG PO TABS: 10.0000 mg | ORAL_TABLET | Freq: Every day | ORAL | 0 refills | Status: DC

## 2018-09-16 NOTE — Progress Notes (Signed)
Stelgatro not covered Switch med

## 2018-10-25 ENCOUNTER — Other Ambulatory Visit: Payer: Self-pay | Admitting: Family Medicine

## 2018-10-25 NOTE — Telephone Encounter (Signed)
Overdue for appt Scheduled for April 1st Will allow limited Rx, one month approved

## 2018-11-16 ENCOUNTER — Encounter: Payer: Self-pay | Admitting: Family Medicine

## 2018-11-16 ENCOUNTER — Ambulatory Visit (INDEPENDENT_AMBULATORY_CARE_PROVIDER_SITE_OTHER): Payer: BLUE CROSS/BLUE SHIELD | Admitting: Family Medicine

## 2018-11-16 DIAGNOSIS — E1165 Type 2 diabetes mellitus with hyperglycemia: Secondary | ICD-10-CM | POA: Diagnosis not present

## 2018-11-16 DIAGNOSIS — J3089 Other allergic rhinitis: Secondary | ICD-10-CM

## 2018-11-16 DIAGNOSIS — E785 Hyperlipidemia, unspecified: Secondary | ICD-10-CM | POA: Diagnosis not present

## 2018-11-16 DIAGNOSIS — Z5181 Encounter for therapeutic drug level monitoring: Secondary | ICD-10-CM

## 2018-11-16 DIAGNOSIS — R252 Cramp and spasm: Secondary | ICD-10-CM

## 2018-11-16 MED ORDER — ATORVASTATIN CALCIUM 40 MG PO TABS: 40.0000 mg | ORAL_TABLET | Freq: Every day | ORAL | 1 refills | Status: DC

## 2018-11-16 MED ORDER — SITAGLIPTIN PHOSPHATE 100 MG PO TABS: 100.0000 mg | ORAL_TABLET | Freq: Every day | ORAL | 1 refills | Status: DC

## 2018-11-16 MED ORDER — FEXOFENADINE HCL 180 MG PO TABS: 180.0000 mg | ORAL_TABLET | Freq: Every day | ORAL | Status: DC

## 2018-11-16 MED ORDER — EMPAGLIFLOZIN 10 MG PO TABS: 10.0000 mg | ORAL_TABLET | Freq: Every day | ORAL | 1 refills | Status: DC

## 2018-11-16 MED ORDER — PIOGLITAZONE HCL 30 MG PO TABS: 30.0000 mg | ORAL_TABLET | Freq: Every day | ORAL | 1 refills | Status: DC

## 2018-11-16 MED ORDER — METFORMIN HCL ER 500 MG PO TB24: 2000.0000 mg | ORAL_TABLET | Freq: Every day | ORAL | 1 refills | Status: DC

## 2018-11-16 MED ORDER — OMEGA-3-ACID ETHYL ESTERS 1 G PO CAPS: 2.0000 g | ORAL_CAPSULE | Freq: Two times a day (BID) | ORAL | 1 refills | Status: DC

## 2018-11-16 NOTE — Assessment & Plan Note (Signed)
Explained allegra is only approved (to my knowledge) for once daily use; he understands; he does not want to try a nasal corticosteroid

## 2018-11-16 NOTE — Progress Notes (Signed)
There were no vitals taken for this visit.   Subjective:    Patient ID: Donald Baxter, male    DOB: 05/29/1976, 43 y.o.   MRN: [id number]  HPI: Donald Baxter is a 43 y.o. male  Chief Complaint  Patient presents with  . Follow-up  . Medication Refill    HPI Virtual Visit via Telephone Note   I connected with Donald Baxter on November 16, 2018 at 12:23 pm EDT by telephone and verified that I am speaking with the correct person using two identifiers.   Staff discussed the limitations, risks, security, and privacy concerns of performing an evaluation and management service by telephone and the availability of in-person appointments. Staff discussed with the patient that he/she may be responsible for charges related to this service. The patient expressed understanding and agreed to proceed. The patient was asked to check her surroundings, be sure to keep phone off of speaker, let me know immediately if someone comes near and our conversation has to be terminated or paused for privacy concerns.  Additional participants: none  Patient location: home in driveway Provider location: home  Call started: 12:23 pm Call terminated: 12:43 pm Total length of call: 20 minutes  He has been "still going" Interim, no other visits No travel, no COVID-19 exposures  Diabetes; "you know me, I hardly ever check my sugars"; the needle part drives him crazy, hard time doing that; he'll check just if jittery or something; he has heard about [employer]; offered Rx and he'll consider, Dex-Com 6 Eye exam overdue; will schedule but his lenses were scratched and needed to see a specialist; 5% and 15% glands in his eyes, needs surgery and just putting it off; does not affect his vision unless focusing on small letters; usual every day vision is okay; does not like stuff in his eyes; does not wear contacts  High triglycerides; high cholesterol; trying to avoid greasy cheeseburgers and stuff like that; being  stuck at home, more homemade foods, rather than going out and eating fast foods; few snacks; no vending snacks; he was prescribed atorvastatin and lovaza, but one was switched; omega-3 acids two pills BID  Obesity; morbid; he does not know his weight; everyone thinks he is losing weight; clothes don't fit differently; he has not noticed any weight loss  Lab Results  Component Value Date   HGBA1C 12.0 (H) 04/05/2018   Leg cramps; taking potassium  Depression screen [facility] 2/9 11/16/2018 04/05/2018 12/29/2017 06/22/2017 04/06/2017  Decreased Interest 0 0 0 0 -  Down, Depressed, Hopeless 0 0 0 0 0  PHQ - 2 Score 0 0 0 0 0  Altered sleeping 0 - - - -  Tired, decreased energy 0 - - - -  Change in appetite 0 - - - -  Feeling bad or failure about yourself  0 - - - -  Trouble concentrating 0 - - - -  Moving slowly or fidgety/restless 0 - - - -  Suicidal thoughts 0 - - - -  PHQ-9 Score 0 - - - -  Difficult doing work/chores Not difficult at all - - - -   Fall Risk  11/16/2018 04/05/2018 12/29/2017 06/22/2017 04/06/2017  Falls in the past year? 0 No No No No    Relevant past medical, surgical, family and social history reviewed Past Medical History:  Diagnosis Date  . Bulge of cervical disc without myelopathy    between c3-c4  . Diabetes mellitus without complication ([place])   .  Folliculitis   . Hyperlipidemia   . Low testosterone   . Obesity   . Psoriasis   . Tarsal tunnel syndrome    Past Surgical History:  Procedure Laterality Date  . ANKLE SURGERY Bilateral    tarsal tunnel    Family History  Problem Relation Age of Onset  . Hypertension Mother   . Hyperlipidemia Mother   . Kidney disease Mother   . Diabetes Father   . Hypertension Father   . Hyperlipidemia Father   . Cancer Maternal Grandmother        not sure what kind  . Cancer Paternal Grandmother        lung?  . Stroke Paternal Grandmother   . Heart disease Paternal Grandfather        heart failure   Social History    Tobacco Use  . Smoking status: Never Smoker  . Smokeless tobacco: Never Used  Substance Use Topics  . Alcohol use: Yes    Comment: occasional  . Drug use: No     [facility] Visit from [facility]/16/2018 in [facility]  AUDIT-C Score  1      Interim medical history since last visit reviewed. Allergies and medications reviewed  Review of Systems Per HPI unless specifically indicated above     Objective:    There were no vitals taken for this visit.  Wt Readings from Last 3 Encounters:  04/05/18 276 lb 14.4 oz (125.6 kg)  01/31/18 270 lb (122.5 kg)  12/29/17 263 lb 8 oz (119.5 kg)    Physical Exam Pulmonary:     Effort: No respiratory distress.  Neurological:     Mental Status: He is alert.  Psychiatric:        Speech: Speech is not rapid and pressured, delayed or slurred.     Results for orders placed or performed in visit on 04/05/18  Culture, Group A Strep  Result Value Ref Range   MICRO NUMBER: [id number]    SPECIMEN QUALITY: ADEQUATE    SOURCE: THROAT    STATUS: FINAL    RESULT: No group A Streptococcus isolated   Lipid panel  Result Value Ref Range   Cholesterol 184 <200 mg/dL   HDL 32 (L) >40 mg/dL   Triglycerides 435 (H) <150 mg/dL   LDL Cholesterol (Calc)  mg/dL (calc)   Total CHOL/HDL Ratio 5.8 (H) <5.0 (calc)   Non-HDL Cholesterol (Calc) 152 (H) <130 mg/dL (calc)  COMPLETE METABOLIC PANEL WITH GFR  Result Value Ref Range   Glucose, Bld 221 (H) 65 - 99 mg/dL   BUN 13 7 - 25 mg/dL   Creat 1.01 0.60 - 1.35 mg/dL   GFR, Est Non African American 91 > OR = 60 mL/min/1.58m2   GFR, Est African American 105 > OR = 60 mL/min/1.18m2   BUN/Creatinine Ratio NOT APPLICABLE 6 - 22 (calc)   Sodium 137 135 - 146 mmol/L   Potassium 4.6 3.5 - 5.3 mmol/L   Chloride 102 98 - 110 mmol/L   CO2 26 20 - 32 mmol/L   Calcium 9.3 8.6 - 10.3 mg/dL   Total Protein 6.7 6.1 - 8.1 g/dL   Albumin 4.0 3.6 - 5.1 g/dL   Globulin 2.7 1.9 - 3.7 g/dL (calc)   AG Ratio 1.5  1.0 - 2.5 (calc)   Total Bilirubin 0.4 0.2 - 1.2 mg/dL   Alkaline phosphatase (APISO) 57 40 - 115 U/L   AST 14 10 - 40 U/L   ALT 15 9 -  46 U/L  Hemoglobin A1C w/out eAG  Result Value Ref Range   Hgb A1c MFr Bld 12.0 (H) <5.7 % of total Hgb  POCT rapid strep A  Result Value Ref Range   Rapid Strep A Screen Negative Negative      Assessment & Plan:   Problem List Items Addressed This Visit      Respiratory   Allergic rhinitis    Explained allegra is only approved (to my knowledge) for once daily use; he understands; he does not want to try a nasal corticosteroid        Endocrine   DM (diabetes mellitus), type 2, uncontrolled ([place]) - Primary (Chronic)    Explained that he is at higher risk of complications due to NATFT-73 if he contracts it; discussed safeguards to take, hand santizer, don't touch face, tec. Offered to get labs just in and out here if he is comfortable or we could postpone it a few more months, but we won't know how he's doing without labs; he understands and agrees to come in for labs (last A1c was uncontrolled and was >6 months ago); he'll schedule eye exam soon when able with pandemic occurring; discussed the Dexcom G6 system and [employer]; he will consider and call back for Rx if he wants to get one; he is still reluctant to check sugars; praise given for attempts at health eating; weight loss would be helpful      Relevant Medications   atorvastatin (LIPITOR) 40 MG tablet   empagliflozin (JARDIANCE) 10 MG TABS tablet   sitaGLIPtin (JANUVIA) 100 MG tablet   metFORMIN (GLUCOPHAGE-XR) 500 MG 24 hr tablet   pioglitazone (ACTOS) 30 MG tablet   Other Relevant Orders   Hemoglobin A1c   Lipid panel   Microalbumin / creatinine urine ratio     Other   Morbid obesity (HCC) (Chronic)   Relevant Medications   empagliflozin (JARDIANCE) 10 MG TABS tablet   sitaGLIPtin (JANUVIA) 100 MG tablet   metFORMIN (GLUCOPHAGE-XR) 500 MG 24 hr tablet   pioglitazone (ACTOS)  30 MG tablet   Medication monitoring encounter    Check liver and kidneys      Relevant Orders   COMPLETE METABOLIC PANEL WITH GFR   Dyslipidemia (Chronic)    Patient will come for fasting labs in the next week or two; he is trying to eat better; continue statin plus omega-3; refills sent      Relevant Medications   atorvastatin (LIPITOR) 40 MG tablet   omega-3 acid ethyl esters (LOVAZA) 1 g capsule   Other Relevant Orders   Lipid panel    Other Visit Diagnoses    Leg cramp       check K+ and Mg2+   Relevant Orders   Magnesium       Follow up plan: No follow-ups on file.  An after-visit summary was printed and given to the patient at [place].  Please see the patient instructions which may contain other information and recommendations beyond what is mentioned above in the assessment and plan.  Meds ordered this encounter  Medications  . atorvastatin (LIPITOR) 40 MG tablet    Sig: Take 1 tablet (40 mg total) by mouth daily.    Dispense:  90 tablet    Refill:  1  . empagliflozin (JARDIANCE) 10 MG TABS tablet    Sig: Take 10 mg by mouth daily.    Dispense:  90 tablet    Refill:  1  . sitaGLIPtin (JANUVIA) 100 MG  tablet    Sig: Take 1 tablet (100 mg total) by mouth daily.    Dispense:  90 tablet    Refill:  1  . omega-3 acid ethyl esters (LOVAZA) 1 g capsule    Sig: Take 2 capsules (2 g total) by mouth 2 (two) times daily.    Dispense:  360 capsule    Refill:  1    Stop vascepa  . metFORMIN (GLUCOPHAGE-XR) 500 MG 24 hr tablet    Sig: Take 4 tablets (2,000 mg total) by mouth daily.    Dispense:  360 tablet    Refill:  1  . pioglitazone (ACTOS) 30 MG tablet    Sig: Take 1 tablet (30 mg total) by mouth daily. For diabetes    Dispense:  90 tablet    Refill:  1    Changing strength; please cancel other refills of 15 mg  . fexofenadine (ALLEGRA) 180 MG tablet    Sig: Take 1 tablet (180 mg total) by mouth daily.    Orders Placed This Encounter  Procedures  .  Hemoglobin A1c  . Lipid panel  . Microalbumin / creatinine urine ratio  . COMPLETE METABOLIC PANEL WITH GFR  . Magnesium

## 2018-11-16 NOTE — Assessment & Plan Note (Signed)
Check liver and kidneys 

## 2018-11-16 NOTE — Assessment & Plan Note (Signed)
Explained that he is at higher risk of complications due to XAJLU-72 if he contracts it; discussed safeguards to take, hand santizer, don't touch face, tec. Offered to get labs just in and out here if he is comfortable or we could postpone it a few more months, but we won't know how he's doing without labs; he understands and agrees to come in for labs (last A1c was uncontrolled and was >6 months ago); he'll schedule eye exam soon when able with pandemic occurring; discussed the Dexcom G6 system and [place]; he will consider and call back for Rx if he wants to get one; he is still reluctant to check sugars; praise given for attempts at health eating; weight loss would be helpful

## 2018-11-16 NOTE — Assessment & Plan Note (Signed)
Patient will come for fasting labs in the next week or two; he is trying to eat better; continue statin plus omega-3; refills sent

## 2018-12-13 ENCOUNTER — Encounter: Payer: Self-pay | Admitting: Family Medicine

## 2018-12-14 ENCOUNTER — Encounter: Payer: Self-pay | Admitting: Nurse Practitioner

## 2018-12-14 ENCOUNTER — Ambulatory Visit (INDEPENDENT_AMBULATORY_CARE_PROVIDER_SITE_OTHER): Payer: BLUE CROSS/BLUE SHIELD | Admitting: Nurse Practitioner

## 2018-12-14 ENCOUNTER — Other Ambulatory Visit: Payer: Self-pay

## 2018-12-14 DIAGNOSIS — E1165 Type 2 diabetes mellitus with hyperglycemia: Secondary | ICD-10-CM | POA: Diagnosis not present

## 2018-12-14 DIAGNOSIS — L0291 Cutaneous abscess, unspecified: Secondary | ICD-10-CM | POA: Diagnosis not present

## 2018-12-14 LAB — POCT GLYCOSYLATED HEMOGLOBIN (HGB A1C): HbA1c, POC (controlled diabetic range): 10.6 % — AB (ref 0.0–7.0)

## 2018-12-14 MED ORDER — DOXYCYCLINE HYCLATE 100 MG PO TABS: 100.0000 mg | ORAL_TABLET | Freq: Two times a day (BID) | ORAL | 0 refills | Status: DC

## 2018-12-14 NOTE — Progress Notes (Signed)
Virtual Visit via Video Note  I connected with Donald Baxter on 12/14/18 at  1:40 PM EDT by a video enabled telemedicine application and verified that I am speaking with the correct person using two identifiers.   Staff discussed the limitations of evaluation and management by telemedicine and the availability of in person appointments. The patient expressed understanding and agreed to proceed.  Patient location: home  My location: home office Other people present: wife-Elizabeth Syverson  HPI  Patient has been getting frequent abscesses for the past few years. States typically gets the on the back of head and armpits. He is currently presenting with two- one is under left armpit and posterior scalp. Gets small ones monthly, but gets large ones every 6 months or so. States uses a hot wash cloth and it usually comes to a head and resolves.   Patient has diabetes, takes medicines as prescribed. Does not check blood sugars. Eats whatever he wants. Has not seen endocrinologist due to pandemidic Denies polydipsia, polyphagia, polyuria.  Lab Results  Component Value Date   HGBA1C 12.0 (H) 04/05/2018      PHQ2/9: Depression screen [facility] 2/9 12/14/2018 11/16/2018 04/05/2018 12/29/2017 06/22/2017  Decreased Interest 0 0 0 0 0  Down, Depressed, Hopeless 0 0 0 0 0  PHQ - 2 Score 0 0 0 0 0  Altered sleeping 0 0 - - -  Tired, decreased energy 0 0 - - -  Change in appetite 0 0 - - -  Feeling bad or failure about yourself  0 0 - - -  Trouble concentrating 0 0 - - -  Moving slowly or fidgety/restless 0 0 - - -  Suicidal thoughts 0 0 - - -  PHQ-9 Score 0 0 - - -  Difficult doing work/chores Not difficult at all Not difficult at all - - -    PHQ reviewed. Negative  Patient Active Problem List   Diagnosis Date Noted  . Recurrent boils 04/05/2018  . Morbid obesity ([place]) 04/05/2018  . DM (diabetes mellitus), type 2, uncontrolled ([place]) 12/29/2017  . Candidiasis 12/29/2017  . Medication monitoring  encounter 09/04/2015  . Low testosterone 02/12/2015  . Allergic dermatitis 02/12/2015  . Dyslipidemia 02/12/2015  . Allergic rhinitis 02/12/2015    Past Medical History:  Diagnosis Date  . Bulge of cervical disc without myelopathy    between c3-c4  . Diabetes mellitus without complication ([place])   . Folliculitis   . Hyperlipidemia   . Low testosterone   . Obesity   . Psoriasis   . Tarsal tunnel syndrome     Past Surgical History:  Procedure Laterality Date  . ANKLE SURGERY Bilateral    tarsal tunnel     Social History   Tobacco Use  . Smoking status: Never Smoker  . Smokeless tobacco: Never Used  Substance Use Topics  . Alcohol use: Yes    Comment: occasional     Current Outpatient Medications:  .  aspirin EC 81 MG tablet, Take 81 mg by mouth daily., Disp: , Rfl:  .  atorvastatin (LIPITOR) 40 MG tablet, Take 1 tablet (40 mg total) by mouth daily., Disp: 90 tablet, Rfl: 1 .  Blood Glucose Monitoring Suppl ([place]) W/DEVICE KIT, Patient asked for VERIFLEX if that exists; staff found VERIO; please correct if that type exists; dx uncontrolled type 2 DM, Disp: 1 kit, Rfl: 0 .  empagliflozin (JARDIANCE) 10 MG TABS tablet, Take 10 mg by mouth daily., Disp: 90 tablet, Rfl: 1 .  famotidine (PEPCID) 20 MG tablet, Take 20 mg by mouth daily., Disp: , Rfl:  .  fexofenadine (ALLEGRA) 180 MG tablet, Take 1 tablet (180 mg total) by mouth daily., Disp: , Rfl:  .  glucose blood (ONETOUCH VERIO) test strip, Check fingerstick blood sugars once a day on average; uncontrolled type 2 dm with hyperglycemia, Disp: 100 each, Rfl: 1 .  metFORMIN (GLUCOPHAGE-XR) 500 MG 24 hr tablet, Take 4 tablets (2,000 mg total) by mouth daily., Disp: 360 tablet, Rfl: 1 .  nystatin cream (MYCOSTATIN), Apply 1 application topically 2 (two) times daily., Disp: 30 g, Rfl: 2 .  omega-3 acid ethyl esters (LOVAZA) 1 g capsule, Take 2 capsules (2 g total) by mouth 2 (two) times daily., Disp: 360  capsule, Rfl: 1 .  ONETOUCH DELICA LANCETS FINE MISC, Check fingerstick blood sugars once a day on average; uncontrolled type 2 dm with hyperglycemia, Disp: 100 each, Rfl: 1 .  pioglitazone (ACTOS) 30 MG tablet, Take 1 tablet (30 mg total) by mouth daily. For diabetes, Disp: 90 tablet, Rfl: 1 .  POTASSIUM GLUCONATE PO, Take 1 tablet by mouth daily., Disp: , Rfl:  .  sitaGLIPtin (JANUVIA) 100 MG tablet, Take 1 tablet (100 mg total) by mouth daily., Disp: 90 tablet, Rfl: 1 .  triamcinolone cream (KENALOG) 0.1 %, Apply 1 application topically 2 (two) times daily as needed., Disp: 45 g, Rfl: 1  Allergies  Allergen Reactions  . Invokana [Canagliflozin]     Yeast infection    ROS   No other specific complaints in a complete review of systems (except as listed in HPI above).  Objective  There were no vitals filed for this visit.   There is no height or weight on file to calculate BMI.  Nursing Note and Vital Signs reviewed.  Physical Exam Skin:         Constitutional: Patient appears well-developed and well-nourished. No distress.  HENT: Head: Normocephalic and atraumatic. Pulmonary/Chest: Effort normal  Musculoskeletal: Normal range of motion,  Neurological: he is alert and oriented to person, place, and time. speech and gait are normal.  Skin:  Abscess to posterior scalp red, raised draining. Abscess to left arm put red raised, no drainage noted  Psychiatric: Patient has a normal mood and affect. behavior is normal. Judgment and thought content normal.    Assessment & Plan  1. Abscess Discussed prevention, discussed CHG bath once a week , possible referral to derm - doxycycline (VIBRA-TABS) 100 MG tablet; Take 1 tablet (100 mg total) by mouth 2 (two) times daily.  Dispense: 20 tablet; Refill: 0  2. Uncontrolled type 2 diabetes mellitus with hyperglycemia ([place]) Discussed in great detail- increasing risk for infections and long-term consequences. Initially refusing any  meds other than PO but states will get POC in car and we can have discussion about better management. - POCT HgB A1C   Follow Up Instructions:  Follow up in 1 week for diabetes   I discussed the assessment and treatment plan with the patient. The patient was provided an opportunity to ask questions and all were answered. The patient agreed with the plan and demonstrated an understanding of the instructions.   The patient was advised to call back or seek an in-person evaluation if the symptoms worsen or if the condition fails to improve as anticipated.  I provided 21 minutes of non-face-to-face time during this encounter.   Fredderick Severance, NP

## 2018-12-21 ENCOUNTER — Other Ambulatory Visit: Payer: Self-pay

## 2018-12-21 ENCOUNTER — Ambulatory Visit (INDEPENDENT_AMBULATORY_CARE_PROVIDER_SITE_OTHER): Payer: BLUE CROSS/BLUE SHIELD | Admitting: Nurse Practitioner

## 2018-12-21 ENCOUNTER — Encounter: Payer: Self-pay | Admitting: Nurse Practitioner

## 2018-12-21 VITALS — Temp 97.8°F | Ht 73.0 in | Wt 277.0 lb

## 2018-12-21 DIAGNOSIS — L0293 Carbuncle, unspecified: Secondary | ICD-10-CM | POA: Diagnosis not present

## 2018-12-21 DIAGNOSIS — E1165 Type 2 diabetes mellitus with hyperglycemia: Secondary | ICD-10-CM | POA: Diagnosis not present

## 2018-12-21 MED ORDER — DEXCOM G6 SENSOR MISC: 1.0000 | Freq: Every day | 6 refills | Status: DC

## 2018-12-21 MED ORDER — SEMAGLUTIDE(0.25 OR 0.5MG/DOS) 2 MG/1.5ML ~~LOC~~ SOPN: PEN_INJECTOR | SUBCUTANEOUS | Status: DC

## 2018-12-21 MED ORDER — DEXCOM G6 TRANSMITTER MISC: 1.0000 | Freq: Every day | 6 refills | Status: DC

## 2018-12-21 MED ORDER — DEXCOM G6 RECEIVER DEVI: 1.0000 | Freq: Every day | 6 refills | Status: DC

## 2018-12-21 MED ORDER — SEMAGLUTIDE (1 MG/DOSE) 2 MG/1.5ML ~~LOC~~ SOPN: 1.0000 mg | PEN_INJECTOR | SUBCUTANEOUS | 0 refills | Status: DC

## 2018-12-21 NOTE — Progress Notes (Signed)
Virtual Visit via Video Note  I connected with Donald Baxter on 12/21/18 at  1:20 PM EDT by a video enabled telemedicine application and verified that I am speaking with the correct person using two identifiers.   Staff discussed the limitations of evaluation and management by telemedicine and the availability of in person appointments. The patient expressed understanding and agreed to proceed.  Patient location: home  My location: work office Other people present:  none HPI Patient was started on doxycyline and much improved since then. Taking it as prescribed Abscess on scalp much smaller and small drainage, left underarm abscess no longer draining.  Uncontrolled Diabetes Metformin 1079m BID Januvia 1015mdaily, Actos 30 mg daily, Jardiance 1077maily.  States is bad at remembering to take his medications, states remembers taking it maybe 4/7 days. States This past week has taken it every day.  Denies personal or family history of thyroid cancer or pancreatitis  24 hour Diet recall: Breakfast- 2 chocolate chip waffles  Lunch:Hotdog and fries  Snacks: couple of packs of low sugar gummies Dinner: sweet potato with butter and brown sugar, pork chops   Drinks: milk, coffee, diet coke- about 3 16 ounces, water- 3 16 ounces of water or more.   Lab Results  Component Value Date   HGBA1C 10.6 (A) 12/14/2018    PHQ2/9: [facility] 12/21/2018 12/14/2018 11/16/2018 04/05/2018 12/29/2017  Decreased Interest 0 0 0 0 0  Down, Depressed, Hopeless 0 0 0 0 0  PHQ - 2 Score 0 0 0 0 0  Altered sleeping 0 0 0 - -  Tired, decreased energy 0 0 0 - -  Change in appetite 0 0 0 - -  Feeling bad or failure about yourself  0 0 0 - -  Trouble concentrating 0 0 0 - -  Moving slowly or fidgety/restless 0 0 0 - -  Suicidal thoughts 0 0 0 - -  PHQ-9 Score 0 0 0 - -  Difficult doing work/chores Not difficult at all Not difficult at all Not difficult at all - -   PHQ reviewed.  Negative  Patient Active Problem List   Diagnosis Date Noted  . Recurrent boils 04/05/2018  . Morbid obesity ([place]8/20/2019  . DM (diabetes mellitus), type 2, uncontrolled ([place]5/15/2019  . Candidiasis 12/29/2017  . Medication monitoring encounter 09/04/2015  . Low testosterone 02/12/2015  . Allergic dermatitis 02/12/2015  . Dyslipidemia 02/12/2015  . Allergic rhinitis 02/12/2015    Past Medical History:  Diagnosis Date  . Bulge of cervical disc without myelopathy    between c3-c4  . Diabetes [place] without complication ([place] . Folliculitis   . Hyperlipidemia   . Low testosterone   . Obesity   . Psoriasis   . Tarsal tunnel syndrome     Past Surgical History:  Procedure Laterality Date  . ANKLE SURGERY Bilateral    tarsal tunnel     Social History   Tobacco Use  . Smoking status: Never Smoker  . Smokeless tobacco: Never Used  Substance Use Topics  . Alcohol use: Yes    Comment: occasional     Current Outpatient Medications:  .  aspirin EC 81 MG tablet, Take 81 mg by mouth daily., Disp: , Rfl:  .  atorvastatin (LIPITOR) 40 MG tablet, Take 1 tablet (40 mg total) by mouth daily., Disp: 90 tablet, Rfl: 1 .  Blood Glucose Monitoring Suppl ([place]/DEVICE KIT, Patient asked for VERIFLEX if that  exists; staff found VERIO; please correct if that type exists; dx uncontrolled type 2 DM, Disp: 1 kit, Rfl: 0 .  doxycycline (VIBRA-TABS) 100 MG tablet, Take 1 tablet (100 mg total) by mouth 2 (two) times daily., Disp: 20 tablet, Rfl: 0 .  empagliflozin (JARDIANCE) 10 MG TABS tablet, Take 10 mg by mouth daily., Disp: 90 tablet, Rfl: 1 .  famotidine (PEPCID) 20 MG tablet, Take 20 mg by mouth daily., Disp: , Rfl:  .  fexofenadine (ALLEGRA) 180 MG tablet, Take 1 tablet (180 mg total) by mouth daily., Disp: , Rfl:  .  glucose blood (ONETOUCH VERIO) test strip, Check fingerstick blood sugars once a day on average; uncontrolled type 2 dm with hyperglycemia,  Disp: 100 each, Rfl: 1 .  metFORMIN (GLUCOPHAGE-XR) 500 MG 24 hr tablet, Take 4 tablets (2,000 mg total) by mouth daily., Disp: 360 tablet, Rfl: 1 .  nystatin cream (MYCOSTATIN), Apply 1 application topically 2 (two) times daily., Disp: 30 g, Rfl: 2 .  omega-3 acid ethyl esters (LOVAZA) 1 g capsule, Take 2 capsules (2 g total) by mouth 2 (two) times daily., Disp: 360 capsule, Rfl: 1 .  ONETOUCH DELICA LANCETS FINE MISC, Check fingerstick blood sugars once a day on average; uncontrolled type 2 dm with hyperglycemia, Disp: 100 each, Rfl: 1 .  pioglitazone (ACTOS) 30 MG tablet, Take 1 tablet (30 mg total) by mouth daily. For diabetes, Disp: 90 tablet, Rfl: 1 .  POTASSIUM GLUCONATE PO, Take 1 tablet by mouth daily., Disp: , Rfl:  .  sitaGLIPtin (JANUVIA) 100 MG tablet, Take 1 tablet (100 mg total) by mouth daily., Disp: 90 tablet, Rfl: 1 .  triamcinolone cream (KENALOG) 0.1 %, Apply 1 application topically 2 (two) times daily as needed., Disp: 45 g, Rfl: 1  Allergies  Allergen Reactions  . Invokana [Canagliflozin]     Yeast infection    ROS   No other specific complaints in a complete review of systems (except as listed in HPI above).  Objective  Vitals:   12/21/18 1125  Temp: 97.8 F (36.6 C)  TempSrc: Oral  Weight: 277 lb (125.6 kg)  Height: '6\' 1"'  (1.854 m)    Body mass index is 36.55 kg/m.  Nursing Note and Vital Signs reviewed.  Physical Exam   Constitutional: Patient appears well-developed and well-nourished. No distress.  HENT: Head: Normocephalic and atraumatic. Pulmonary/Chest: Effort normal  Musculoskeletal: Normal range of motion,  Neurological: he is alert and oriented to person, place, and time. speech and gait are normal.  Skin: abscess on scalp significantly smaller, mild drainage, abscess in underarm closed and improving.  Psychiatric: Patient has a normal mood and affect. behavior is normal. Judgment and thought content normal.    Assessment & Plan  1.  Uncontrolled type 2 diabetes mellitus with hyperglycemia (HCC) Discussed diet and treatment in great length. Stop Januvia start Ozempic. Patient declines started daily insulin, states is unable to stick himself but will try ozempic because wife it forcing him too. Additionally states is willing to see endocrinology for improved management. He wants to try dexcom reader, if it is too expensive he will let us know for rx for glucometer to keep up with his sugars.  - Ambulatory referral to Endocrinology - Continuous Blood Gluc Receiver ([place]) DEVI; 1 Device by Does not apply route daily.  Dispense: 1 Device; Refill: 6 - Continuous Blood Gluc Sensor (DEXCOM G6 SENSOR) MISC; 1 Device by Does not apply route daily.  Dispense: 6 each; Refill:  6 - Continuous Blood Gluc Transmit (DEXCOM G6 TRANSMITTER) MISC; 1 Device by Does not apply route daily.  Dispense: 1 each; Refill: 6 - Semaglutide,0.25 or 0.5MG/DOS, (OZEMPIC, 0.25 OR 0.5 MG/DOSE,) 2 MG/1.5ML SOPN; Inject 0.25 mg into the skin once a week for 28 days, THEN 0.5 mg once a week for 28 days.  Dispense: 3 pen; Refill: 01 - Semaglutide, 1 MG/DOSE, (OZEMPIC, 1 MG/DOSE,) 2 MG/1.5ML SOPN; Inject 1 mg into the skin once a week.  Dispense: 6 pen; Refill: 0  2. Recurrent boils Improving     Follow Up Instructions: 3 month follow-up    I discussed the assessment and treatment plan with the patient. The patient was provided an opportunity to ask questions and all were answered. The patient agreed with the plan and demonstrated an understanding of the instructions.   The patient was advised to call back or seek an in-person evaluation if the symptoms worsen or if the condition fails to improve as anticipated.  I provided 26 minutes of non-face-to-face time during this encounter.   Fredderick Severance, NP

## 2018-12-21 NOTE — Patient Instructions (Addendum)

## 2018-12-23 ENCOUNTER — Encounter: Payer: Self-pay | Admitting: Family Medicine

## 2018-12-30 ENCOUNTER — Telehealth: Payer: Self-pay

## 2018-12-30 NOTE — Telephone Encounter (Signed)
PA done.  Copied from [place] [id number]. Topic: General - Other >> Dec 29, 2018 11:06 AM Percell Belt A wrote: Reason for CRM: pt called in and state that the Semaglutide,0.25 or 0.5MG /DOS, (OZEMPIC, 0.25 OR 0.5 MG/DOSE,) 2 MG/1.5ML SOPN is requiring a PA done on it.  [place], [place] [address] [place] [phone] (Phone) [phone] (Fax)

## 2019-01-06 ENCOUNTER — Encounter: Payer: Self-pay | Admitting: Family Medicine

## 2019-01-10 ENCOUNTER — Other Ambulatory Visit: Payer: Self-pay | Admitting: Nurse Practitioner

## 2019-01-10 DIAGNOSIS — E1165 Type 2 diabetes mellitus with hyperglycemia: Secondary | ICD-10-CM

## 2019-01-13 ENCOUNTER — Telehealth: Payer: Self-pay

## 2019-01-13 NOTE — Telephone Encounter (Signed)
Copied from [place] [id number]. Topic: General - Other >> Jan 12, 2019  2:21 PM Carolyn Stare wrote:  Pt call to say the below med is requiring a PA for the started kit of the below medication   Semaglutide, 1 MG/DOSE, (OZEMPIC, 1 MG/DOSE,) 2 MG/1.5ML SOPN  [name] (Key: [id number]) - [phone] Ozempic (0.25 or 0.5 MG/DOSE) 2MG/1.5ML pen-injectors Outcome: Approved  Created: May 6th, 2020 [phone]  Sent: May 12th, 2020  Open  Archived - Outcome: Approved  Patient was informed.

## 2019-01-18 ENCOUNTER — Encounter: Payer: Self-pay | Admitting: Family Medicine

## 2019-01-23 DIAGNOSIS — E669 Obesity, unspecified: Secondary | ICD-10-CM | POA: Diagnosis not present

## 2019-01-23 DIAGNOSIS — E785 Hyperlipidemia, unspecified: Secondary | ICD-10-CM | POA: Diagnosis not present

## 2019-01-23 DIAGNOSIS — E1165 Type 2 diabetes mellitus with hyperglycemia: Secondary | ICD-10-CM | POA: Diagnosis not present

## 2019-02-06 ENCOUNTER — Encounter: Payer: Self-pay | Admitting: Family Medicine

## 2019-03-23 LAB — HM DIABETES EYE EXAM

## 2019-05-10 ENCOUNTER — Other Ambulatory Visit: Payer: Self-pay

## 2019-05-10 DIAGNOSIS — Z20822 Contact with and (suspected) exposure to covid-19: Secondary | ICD-10-CM

## 2019-05-11 LAB — NOVEL CORONAVIRUS, NAA: SARS-CoV-2, NAA: NOT DETECTED

## 2019-05-12 ENCOUNTER — Encounter: Payer: Self-pay | Admitting: Family Medicine

## 2019-06-25 ENCOUNTER — Encounter: Payer: Self-pay | Admitting: Family Medicine

## 2019-06-27 ENCOUNTER — Ambulatory Visit (INDEPENDENT_AMBULATORY_CARE_PROVIDER_SITE_OTHER): Payer: Self-pay | Admitting: Family Medicine

## 2019-06-27 ENCOUNTER — Encounter: Payer: Self-pay | Admitting: Family Medicine

## 2019-06-27 ENCOUNTER — Other Ambulatory Visit: Payer: Self-pay

## 2019-06-27 ENCOUNTER — Ambulatory Visit: Payer: Self-pay | Admitting: Family Medicine

## 2019-06-27 VITALS — Temp 97.2°F | Ht 74.0 in | Wt 272.8 lb

## 2019-06-27 DIAGNOSIS — L03811 Cellulitis of head [any part, except face]: Secondary | ICD-10-CM

## 2019-06-27 MED ORDER — MUPIROCIN 2 % EX OINT: 1.0000 "application " | TOPICAL_OINTMENT | Freq: Two times a day (BID) | CUTANEOUS | 0 refills | Status: DC

## 2019-06-27 MED ORDER — DOXYCYCLINE HYCLATE 100 MG PO TABS: 100.0000 mg | ORAL_TABLET | Freq: Two times a day (BID) | ORAL | 0 refills | Status: DC

## 2019-06-27 MED ORDER — CHLORHEXIDINE GLUCONATE 4 % EX LIQD: Freq: Every day | CUTANEOUS | 0 refills | Status: DC | PRN

## 2019-06-27 NOTE — Patient Instructions (Signed)
Please follow-up if your infection is not starting to improve in the next 72 hours.  If the areas of swelling tenderness and redness are spreading after 48 hours we need to see you right away or please go to an urgent care to be rechecked.  We will call you tomorrow with cash pay on the basic labs that we need to do so that it is safe to refill you medicines.  Please schedule an appointment in Jan so we can recheck everything and help get you back on track.   Cellulitis, Adult  Cellulitis is a skin infection. The infected area is often warm, red, swollen, and sore. It occurs most often in the arms and lower legs. It is very important to get treated for this condition. What are the causes? This condition is caused by bacteria. The bacteria enter through a break in the skin, such as a cut, burn, insect bite, open sore, or crack. What increases the risk? This condition is more likely to occur in people who:  Have a weak body defense system (immune system).  Have open cuts, burns, bites, or scrapes on the skin.  Are older than 44 years of age.  Have a blood sugar problem (diabetes).  Have a long-lasting (chronic) liver disease (cirrhosis) or kidney disease.  Are very overweight (obese).  Have a skin problem, such as: ? Itchy rash (eczema). ? Slow movement of blood in the veins (venous stasis). ? Fluid buildup below the skin (edema).  Have been treated with high-energy rays (radiation).  Use IV drugs. What are the signs or symptoms? Symptoms of this condition include:  Skin that is: ? Red. ? Streaking. ? Spotting. ? Swollen. ? Sore or painful when you touch it. ? Warm.  A fever.  Chills.  Blisters. How is this diagnosed? This condition is diagnosed based on:  Medical history.  Physical exam.  Blood tests.  Imaging tests. How is this treated? Treatment for this condition may include:  Medicines to treat infections or allergies.  Home care, such as: ?  Rest. ? Placing cold or warm cloths (compresses) on the skin.  Hospital care, if the condition is very bad. Follow these instructions at home: Medicines  Take over-the-counter and prescription medicines only as told by your doctor.  If you were prescribed an antibiotic medicine, take it as told by your doctor. Do not stop taking it even if you start to feel better. General instructions   Drink enough fluid to keep your pee (urine) pale yellow.  Do not touch or rub the infected area.  Raise (elevate) the infected area above the level of your heart while you are sitting or lying down.  Place cold or warm cloths on the area as told by your doctor.  Keep all follow-up visits as told by your doctor. This is important. Contact a doctor if:  You have a fever.  You do not start to get better after 1-2 days of treatment.  Your bone or joint under the infected area starts to hurt after the skin has healed.  Your infection comes back. This can happen in the same area or another area.  You have a swollen bump in the area.  You have new symptoms.  You feel ill and have muscle aches and pains. Get help right away if:  Your symptoms get worse.  You feel very sleepy.  You throw up (vomit) or have watery poop (diarrhea) for a long time.  You see red streaks coming  from the area.  Your red area gets larger.  Your red area turns dark in color. These symptoms may represent a serious problem that is an emergency. Do not wait to see if the symptoms will go away. Get medical help right away. Call your local emergency services (911 in the [place] Do not drive yourself to the hospital. Summary  Cellulitis is a skin infection. The area is often warm, red, swollen, and sore.  This condition is treated with medicines, rest, and cold and warm cloths.  Take all medicines only as told by your doctor.  Tell your doctor if symptoms do not start to get better after 1-2 days of treatment.  This information is not intended to replace advice given to you by your health care provider. Make sure you discuss any questions you have with your health care provider. Document Released: 01/20/2008 Document Revised: 12/23/2017 Document Reviewed: 12/23/2017 Elsevier Patient Education  2020 [employer].

## 2019-06-27 NOTE — Progress Notes (Signed)
Name: Donald Baxter   MRN: [id number]    DOB: 1976-05-28   Date:06/27/2019       Progress Note  Subjective:    Chief Complaint  Chief Complaint  Patient presents with   Recurrent Skin Infections    staph infection on head. super inflamed, appeared last monday.    I connected with  [name]  on 06/27/19 at  3:20 PM EST by a video enabled telemedicine application and verified that I am speaking with the correct person using two identifiers.  I discussed the limitations of evaluation and management by telemedicine and the availability of in person appointments. The patient expressed understanding and agreed to proceed. Staff also discussed with the patient that there may be a patient responsible charge related to this service. 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  He gets a few infections a year and he calls them "staff infections."  Currently redness swelling and pain she describes an infection started about 10 days ago, is located to the left posterior hairline.  He states it started as a small pimple and now has spread across the back of his left occiput and neck with associated redness, swelling, pain and lymphadenopathy.  He typically takes doxycycline twice a day for 10 days and it has effectively resolved his infection.  He has never had a culture or any antibiotic resistance that he knows of.  He states last time he had antibiotics was in March about 8 months ago.  Unfortunately he had lost his insurance when transitioning jobs and has been off most of his medications for a large part of this year.  He does have diabetes and was referred to endocrinologist.  He states that he is still taking ozempic and doing a lower dose to "stretch it out",  He asks for a refill on metformin today has  been out for a month, and he is also out of all of other medications including Januvia, Actos, Jardiance.  He has not been seen in office since May 2020, last labs that were done were 15 months ago. Was referred to endocrinologist but unable to do work up because of loss of insurance.   Patient states that he will be getting insurance the first of the year because he was able to fix the air during his open enrollment period.  He would like to get on all of his other medications even if he is paying for them with cash, but I have explained to him that we do need some basic lab work before we can safely refill his medicines.   Patient Active Problem List   Diagnosis Date Noted   Recurrent boils 04/05/2018   Morbid obesity ([place]) 04/05/2018   DM (diabetes mellitus), type 2, uncontrolled ([place]) 12/29/2017   Candidiasis 12/29/2017   Medication monitoring encounter 09/04/2015   Low testosterone 02/12/2015   Allergic dermatitis 02/12/2015   Dyslipidemia 02/12/2015   Allergic rhinitis 02/12/2015    Social History   Tobacco Use   Smoking status: Never Smoker   Smokeless tobacco: Never Used  Substance Use Topics   Alcohol use: Yes    Comment: occasional     Current Outpatient Medications:    OZEMPIC, 0.25 OR 0.5 MG/DOSE, 2 MG/1.5ML SOPN, INJECT 0.25 MG UNDER THE SKIN ONCE A WEEK  FOR 28 DAYS, THEN 0.5 MG ONCE A WEEK FOR 28 DAYS, Disp: 4.5 mL, Rfl: 1   aspirin EC 81 MG tablet, Take 81 mg by mouth daily., Disp: , Rfl:    atorvastatin (LIPITOR) 40 MG tablet, Take 1 tablet (40 mg total) by mouth daily. (Patient not taking: Reported on 06/27/2019), Disp: 90 tablet, Rfl: 1   Blood Glucose Monitoring Suppl ([place]) W/DEVICE KIT, Patient asked for VERIFLEX if that exists; staff found VERIO; please correct if that type exists; dx uncontrolled type 2 DM (Patient not taking: Reported on 06/27/2019), Disp: 1 kit, Rfl: 0   Continuous Blood Gluc Receiver (Pace) DEVI, 1 Device by Does not apply route daily. (Patient not taking: Reported on 06/27/2019), Disp: 1 Device, Rfl: 6   Continuous Blood Gluc Sensor (DEXCOM G6 SENSOR) MISC, 1 Device by Does not apply route daily. (Patient not taking: Reported on 06/27/2019), Disp: 6 each, Rfl: 6   Continuous Blood Gluc Transmit (DEXCOM G6 TRANSMITTER) MISC, 1 Device by Does not apply route daily. (Patient not taking: Reported on 06/27/2019), Disp: 1 each, Rfl: 6   doxycycline (VIBRA-TABS) 100 MG tablet, Take 1 tablet (100 mg total) by mouth 2 (two) times daily. (Patient not taking: Reported on 06/27/2019), Disp: 20 tablet, Rfl: 0   empagliflozin (JARDIANCE) 10 MG TABS tablet, Take 10 mg by mouth daily. (Patient not taking: Reported on 06/27/2019), Disp: 90 tablet, Rfl: 1   famotidine (PEPCID) 20 MG tablet, Take 20 mg by mouth daily., Disp: , Rfl:    fexofenadine (ALLEGRA) 180 MG tablet, Take 1 tablet (180 mg total) by mouth daily. (Patient not taking: Reported on 06/27/2019), Disp: , Rfl:    glucose blood (ONETOUCH VERIO) test strip, Check fingerstick blood sugars once a day on average; uncontrolled type 2 dm with hyperglycemia (Patient not taking: Reported on 06/27/2019), Disp: 100 each, Rfl: 1   metFORMIN (GLUCOPHAGE-XR) 500 MG 24 hr tablet, Take 4 tablets (2,000 mg total) by mouth daily. (Patient not taking: Reported on 06/27/2019), Disp: 360 tablet, Rfl: 1   nystatin cream (MYCOSTATIN), Apply 1 application topically 2 (two) times daily. (Patient not taking: Reported on 06/27/2019), Disp: 30 g, Rfl: 2   omega-3 acid ethyl esters (LOVAZA) 1 g capsule, Take 2 capsules (2 g total) by mouth 2 (two) times daily. (Patient not taking: Reported on 06/27/2019), Disp: 360 capsule, Rfl: 1   ONETOUCH DELICA LANCETS FINE MISC, Check fingerstick blood sugars once a day on average; uncontrolled type 2 dm with hyperglycemia (Patient not taking: Reported on 06/27/2019), Disp: 100 each, Rfl: 1   pioglitazone (ACTOS)  30 MG tablet, Take 1 tablet (30 mg total) by mouth daily. For diabetes (Patient not taking: Reported on 06/27/2019), Disp: 90 tablet, Rfl: 1   POTASSIUM GLUCONATE PO, Take 1 tablet by mouth daily., Disp: , Rfl:    Semaglutide, 1 MG/DOSE, (OZEMPIC, 1 MG/DOSE,) 2 MG/1.5ML SOPN, Inject 1 mg into the skin once a week. (Patient not taking: Reported on 06/27/2019), Disp: 6 pen, Rfl: 0   triamcinolone cream (KENALOG) 0.1 %, Apply 1 application topically 2 (two) times daily as needed. (Patient not taking: Reported on 06/27/2019), Disp: 45 g, Rfl: 1  Allergies  Allergen Reactions   Invokana [Canagliflozin]     Yeast infection    I personally reviewed active problem list, medication list, allergies, family history, social history, health maintenance, notes from last encounter, lab results with the patient/caregiver today.  Review of Systems  Constitutional: Negative.  Negative  for activity change, appetite change, chills, diaphoresis, fatigue and fever.  HENT: Negative.   Eyes: Negative.   Respiratory: Negative.   Cardiovascular: Negative.   Gastrointestinal: Negative.   Endocrine: Negative.   Genitourinary: Negative.   Musculoskeletal: Negative.   Skin: Negative.   Allergic/Immunologic: Negative.   Neurological: Negative.  Negative for dizziness, weakness and headaches.  Hematological: Negative.   Psychiatric/Behavioral: Negative.   All other systems reviewed and are negative.    Objective:   Virtual encounter, vitals limited, only able to obtain the following Today's Vitals   06/27/19 1521 06/27/19 1523  Temp: (!) 97.2 F (36.2 C)   Weight: 272 lb 12.8 oz (123.7 kg)   Height: '6\' 2"'  (1.88 m)   PainSc:  4    Body mass index is 35.03 kg/m. Nursing Note and Vital Signs reviewed.  Physical Exam Vitals signs and nursing note reviewed.  Constitutional:      General: He is not in acute distress.    Appearance: He is well-developed. He is obese. He is not ill-appearing,  toxic-appearing or diaphoretic.  HENT:     Head: Normocephalic and atraumatic.     Nose: Nose normal.  Eyes:     Conjunctiva/sclera: Conjunctivae normal.  Neck:     Trachea: No tracheal deviation.  Pulmonary:     Effort: Pulmonary effort is normal. No respiratory distress.     Breath sounds: No stridor.  Musculoskeletal: Normal range of motion.  Skin:    Findings: Erythema present.     Comments: Scab to left lower posterior scalp with visible faint erythema to surrounding skin on neck and in scalp, roughly 20 x 10 cm area.  He points to tender lymph node, but cannot visualize No active drainage visualized  Neurological:     Mental Status: He is alert.     Motor: No abnormal muscle tone.  Psychiatric:        Behavior: Behavior normal.     PE limited by telephone encounter  No results found for this or any previous visit (from the past 72 hour(s)).  Assessment and Plan:   1. Cellulitis of scalp Does appear consistent with cellulitis, patient has had multiple times before, he denies any resistance or any recent antibiotic use only one other time this year, will prescribe doxycycline as he states has been effective.  Also advised in the future that he could try mupirocin to any new or folliculitis areas that develop, can use Hibiclens to try and decrease bacterial burden on his skin.  He was encouraged to follow-up if infection is not improving in the next 72 hours  - doxycycline (VIBRA-TABS) 100 MG tablet; Take 1 tablet (100 mg total) by mouth 2 (two) times daily.  Dispense: 20 tablet; Refill: 0 - mupirocin ointment (BACTROBAN) 2 %; Apply 1 application topically 2 (two) times daily.  Dispense: 22 g; Refill: 0 - chlorhexidine (HIBICLENS) 4 % external liquid; Apply topically daily as needed.  Dispense: 120 mL; Refill: 0  Patient also requested med refills on multiple medicines however there are no labs done for the past 15 months, I did explain to him that we would need to know some  basic information including kidney function and liver function to safely refill some of his regular medicines.  Like the medicines he is asking for would be available and affordable cash pay her with good Rx at several nearby pharmacies.  I explained that I could check on the price of the labs and we can let him  know tomorrow and if he is able to come in and get labs done and I could check them that I could safely refill his medicines.  He will have insurance coverage in just under 2 months he was encouraged to come in January to do everything else that he needs done at that time.   -Red flags and when to present for emergency care or RTC including fever >101.33F, chest pain, shortness of breath, new/worsening/un-resolving symptoms,  reviewed with patient at time of visit. Follow up and care instructions discussed and provided in AVS. - I discussed the assessment and treatment plan with the patient. The patient was provided an opportunity to ask questions and all were answered. The patient agreed with the plan and demonstrated an understanding of the instructions.  I provided 15  minutes of non-face-to-face time during this encounter.  Delsa Grana, PA-C 06/27/19 3:58 PM

## 2019-06-29 ENCOUNTER — Ambulatory Visit
Admission: EM | Admit: 2019-06-29 | Discharge: 2019-06-29 | Disposition: A | Discharge status: Home / Self Care | Payer: Self-pay | Attending: Urgent Care | Admitting: Urgent Care

## 2019-06-29 ENCOUNTER — Other Ambulatory Visit: Payer: Self-pay

## 2019-06-29 DIAGNOSIS — L03811 Cellulitis of head [any part, except face]: Secondary | ICD-10-CM

## 2019-06-29 MED ORDER — TRAMADOL HCL 50 MG PO TABS: 50.0000 mg | ORAL_TABLET | Freq: Two times a day (BID) | ORAL | 0 refills | Status: DC | PRN

## 2019-06-29 MED ORDER — LIDOCAINE HCL (PF) 1 % IJ SOLN: 5.0000 mL | Freq: Once | INTRAMUSCULAR | Status: DC

## 2019-06-29 MED ORDER — CEFTRIAXONE SODIUM 1 G IJ SOLR: 1.0000 g | Freq: Once | INTRAMUSCULAR | Status: DC

## 2019-06-29 MED ORDER — BACITRACIN ZINC 500 UNIT/GM EX OINT: TOPICAL_OINTMENT | Freq: Once | CUTANEOUS | Status: DC

## 2019-06-29 NOTE — ED Triage Notes (Signed)
Pt states he has a abscess that started on Sunday. Pt states it's in the back of his head. Pt has seen his PCP and was given some antibiotics and cream. Pt states the antibiotics are not working. Pt states the abscess is getting bigger.

## 2019-06-29 NOTE — Discharge Instructions (Addendum)
It was very nice seeing you today in clinic. Thank you for entrusting me with your care.   You were treated with antibiotic injection in clinic. Abscess was opened and drained. Continue oral doxycycline and topical mupirocin. Continue to use the antiseptic soap. Use warm compresses several times a day to help area drain. May use Tylenol and/or Ibuprofen as needed for pain/fever.   Make arrangements to follow up with your regular doctor in 2-3 days for re-evaluation if not improving. If your symptoms/condition worsens, please seek follow up care either here or in the ER. Please remember, our [place] providers are "right here with you" when you need [place].   Again, it was my pleasure to take care of you today. Thank you for choosing our clinic. I hope that you start to feel better quickly.   Honor Loh, MSN, APRN, FNP-C, CEN Advanced Practice Provider [facility]

## 2019-06-29 NOTE — ED Provider Notes (Signed)
Donald Baxter   Name: PINK MOREIN DOB: [birth year] MRN: [id number] CSN: [id number] PCP: Donald Courser, MD  Arrival date and time:  06/29/19 0813  Chief Complaint:  Abscess   NOTE: Prior to seeing the patient today, I have reviewed the triage nursing documentation and vital signs. Clinical staff has updated patient's PMH/PSHx, current medication list, and drug allergies/intolerances to ensure comprehensive history available to assist in medical decision making.   History:   HPI: Donald Baxter is a 43 y.o. male who presents today with complaints of a worsening scalp abscess that first declared on 06/25/2019. 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 He was lasted treated with doxycycline back in 10/2018. He was seen via a tele-health visit by Donald Baxter, Donald Baxter on 06/27/2019; notes reviewed. Patient was diagnosed with cellulitis of the scalp and was appropriately prescribed a 10 day course of oral doxycycline and topical mupirocin. He was also provided with a supply of 4% CHG wash to use for daily cleansing purposes. Patient notes that he has filled the prescriptions for the aforementioned interventions and is using them as directed.   Patient presents to clinic today reporting that he feels like the size of his abscess continues to increase. He describes the pain, pressure, and warmth as acutely worsening as well. Patient has been using APAP and IBU without improvement in his symptoms. Secondary to the pain, patient notes that he has been having difficulties sleeping. Donald Baxter has not experienced any associated fevers. He reports intermittent purulent drainage from the abscess to the LEFT side of his occipital scalp. Patient is diabetic and reports that his CBGs have been running in the 350 range. He advises today that he has been off of his oral antidiabetic agents. He states that working with his PCP to get back on his medication and also to get in with  endocrinology for ongoing management of his uncontrolled T2DM.   Past Medical History:  Diagnosis Date  . Bulge of cervical disc without myelopathy    between c3-c4  . Diabetes mellitus without complication ([place])   . Folliculitis   . Hyperlipidemia   . Low testosterone   . Obesity   . Psoriasis   . Tarsal tunnel syndrome     Past Surgical History:  Procedure Laterality Date  . ANKLE SURGERY Bilateral    tarsal tunnel     Family History  Problem Relation Age of Onset  . Hypertension Mother   . Hyperlipidemia Mother   . Kidney disease Mother   . Diabetes Father   . Hypertension Father   . Hyperlipidemia Father   . Cancer Maternal Grandmother        not sure what kind  . Cancer Paternal Grandmother        lung?  . Stroke Paternal Grandmother   . Heart disease Paternal Grandfather        heart failure    Social History   Tobacco Use  . Smoking status: Never Smoker  . Smokeless tobacco: Never Used  Substance Use Topics  . Alcohol use: Yes    Comment: occasional  . Drug use: No    Patient Active Problem List   Diagnosis Date Noted  . Recurrent boils 04/05/2018  . Morbid obesity ([place]) 04/05/2018  . DM (diabetes mellitus), type 2, uncontrolled ([place]) 12/29/2017  . Candidiasis 12/29/2017  . Low testosterone 02/12/2015  . Allergic dermatitis 02/12/2015  . Dyslipidemia 02/12/2015  . Allergic rhinitis 02/12/2015  Home Medications:    No outpatient medications have been marked as taking for the 06/29/19 encounter [facility] Encounter).    Allergies:   Invokana [canagliflozin]  Review of Systems (ROS): Review of Systems  Constitutional: Negative for chills and fever.  Respiratory: Negative for cough and shortness of breath.   Cardiovascular: Negative for chest pain and palpitations.  Gastrointestinal: Negative for nausea and vomiting.  Skin: Positive for color change.       Scalp abscess  Neurological: Negative for dizziness, syncope, weakness and  headaches.  Hematological: Positive for adenopathy.  All other systems reviewed and are negative.    Vital Signs: Today's Vitals   06/29/19 0851 06/29/19 0852 06/29/19 0935 06/29/19 0953  BP: (!) 124/100  (!) 130/95   Pulse: (!) 115  (!) 114   Resp: 18     Temp: 98.8 F (37.1 C)     TempSrc: Oral     SpO2: 99%  98%   Weight:  272 lb (123.4 kg)    PainSc:  9   9     Physical Exam: Physical Exam  Constitutional: He is oriented to person, place, and time and well-developed, well-nourished, and in no distress.  HENT:  Head: Normocephalic and atraumatic.  Mouth/Throat: Mucous membranes are normal.  Eyes: Pupils are equal, round, and reactive to light. EOM are normal.  Neck: Normal range of motion. Neck supple. Muscular tenderness present.    Cardiovascular: Regular rhythm, normal heart sounds and intact distal pulses. Tachycardia present.  Pulmonary/Chest: Effort normal and breath sounds normal. No respiratory distress.  Lymphadenopathy:       Head (left side): Submandibular adenopathy present.    He has cervical adenopathy.       Left cervical: Posterior cervical adenopathy present.  Neurological: He is alert and oriented to person, place, and time. He has normal sensation, normal reflexes and intact cranial nerves. Gait normal.  Skin: Skin is warm and dry. Lesion noted. No rash noted. There is erythema.     Psychiatric: Mood, memory, affect and judgment normal.  Nursing note and vitals reviewed.   Urgent Care Treatments / Results:   LABS: PLEASE NOTE: all labs that were ordered this encounter are listed, however only abnormal results are displayed. Labs Reviewed  AEROBIC/ANAEROBIC CULTURE (SURGICAL/DEEP WOUND)    EKG: -None  RADIOLOGY: No results found.  PROCEDURES: Incision and Drainage  Date/Time: 06/29/2019 9:24 AM Performed by: Donald Kitchens, NP Authorized by: Donald Kitchens, NP   Consent:    Consent obtained:  Verbal   Consent given by:  Patient    Risks discussed:  Bleeding, incomplete drainage, pain, damage to other organs and infection   Alternatives discussed:  No treatment, observation and referral Universal protocol:    Procedure explained and questions answered to patient or proxy's satisfaction: yes     Patient identity confirmed:  Verbally with patient Location:    Type:  Abscess   Size:  12 x 10 cm   Location:  Head   Head location:  Scalp (LEFT posterior) Pre-procedure details:    Skin preparation:  Antiseptic wash, Chloraprep and Hibiclens Anesthesia (see MAR for exact dosages):    Anesthesia method:  Local infiltration   Local anesthetic:  Lidocaine 1% w/o epi Procedure type:    Complexity:  Simple Procedure details:    Needle aspiration: yes     Needle size:  18 G   Incision types:  Stab incision   Incision depth:  Subcutaneous   Scalpel blade:  11   Wound management:  Irrigated with saline, extensive cleaning and probed and deloculated   Drainage:  Purulent and bloody   Drainage amount:  Copious   Wound treatment:  Wound left open   Packing materials:  None Post-procedure details:    Patient tolerance of procedure:  Tolerated well, no immediate complications    MEDICATIONS RECEIVED THIS VISIT:  bacitracin ointment   lidocaine (PF) (XYLOCAINE) 1 % injection 5 mL   cefTRIAXone (ROCEPHIN) injection 1 g    PERTINENT CLINICAL COURSE NOTES/UPDATES:   Initial Impression / Assessment and Plan / Urgent Care Course:  Pertinent labs & imaging results that were available during my care of the patient were personally reviewed by me and considered in my medical decision making (see lab/imaging section of note for values and interpretations).  Donald Baxter is a 43 y.o. male who presents to [facility] today with complaints of Abscess   Patient is well appearing overall in clinic today. He does not appear to be in any acute distress. Presenting symptoms (see HPI) and exam as documented above. Large scalp  abscess painful, erythematous, and warm. Patient using oral and topical ABX as prescribed. He is cleansing area with 4% CHG as directed. Abscess continues to enlarge and is causing patient significant discomfort. The decision was made to drain area in clinic today. Skin anesthetized and single stab incision was performed allowing for the evacuation of a copious amount of sanguinopurulent material. Due to prominent location, the scalp incision was not elongated in efforts to minimize scarring. Pressure applied to all areas surrounding wound in order to manually express as much contents as possible. Aerobic/anaerobic swab collected for culture and sensitivity due to recurrence.   Wound cleansed in clinic by staff. Bacitracin and DSPD applied. Patient educated on need for daily wound care. He was encouraged to keep wound clean and dry. He was instructed to continue daily cleaning using the prescribed CHG wash, and to apply topical mupirocin 2 times daily. Wound may be left open to air while at home, however he was encouraged to cover area while in public to prevent further infection. Patient to monitor for signs and symptoms of progressive infection, which would include increased redness, swelling, streaking, drainage, pain, and the development of a fever. Due to worsening of wound, decision was made to treat in clinic today with ceftriaxone 1 gm IM. He was advised to continue to prescribed course of oral doxycycline prescribed by his PCP. May use Tylenol and/or Ibuprofen as needed for pain/fever. Will send in short supply of Tramadol for PRN use for more severe pain.   Discussed follow up with primary care physician in 2 days for re-evaluation if not improving. I have reviewed the follow up and strict return precautions for any new or worsening symptoms. Patient is aware of symptoms that would be deemed urgent/emergent, and would thus require further evaluation either here or in the emergency department. At the  time of discharge, he verbalized understanding and consent with the discharge plan as it was reviewed with him. All questions were fielded by provider and/or clinic staff prior to patient discharge.    Final Clinical Impressions / Urgent Care Diagnoses:   Final diagnoses:  Abscess or cellulitis of scalp    New Prescriptions:  [place] Controlled Substance Registry consulted? Yes, I have consulted the [place] Controlled Substances Registry for this patient, and feel the risk/benefit ratio today is favorable for proceeding with this prescription for a controlled substance.  Marland Kitchen  Discussed use of controlled substance medication to treat his acute pain.  o Reviewed [place] STOP Act regulations  o Clinic does not refill controlled substances over the phone without face to face evaluation.  . Safety precautions reviewed.  o Medications should not be bitten, chewed, sold, or taken with alcohol.  o Avoid use while working, driving, or operating heavy machinery.  o Side effects associated with the use of this particular medication reviewed. - Patient understands that this medication can cause CNS depression, increase his risk of falls, and even lead to overdose that may result in death, if used outside of the parameters that he and I discussed.  With all of this in mind, he knowingly accepts the risks and responsibilities associated with intended course of treatment, and elects to responsibly proceed as discussed.  Meds ordered this encounter  Medications  . traMADol (ULTRAM) 50 MG tablet    Sig: Take 1 tablet (50 mg total) by mouth every 12 (twelve) hours as needed.    Dispense:  10 tablet    Refill:  0    Recommended Follow up Care:  Patient encouraged to follow up with the following provider within the specified time frame, or sooner as dictated by the severity of his symptoms. As always, he was instructed that for any urgent/emergent care needs, he should seek care either here or in the emergency department  for more immediate evaluation.  Follow-up Information    Lada, Satira Anis, MD In 2 days.   Specialty: Family Medicine Why: General reassessment of symptoms if not improving Contact information: [address] [place] [address] [phone]         NOTE: This note was prepared using Dragon dictation software along with smaller phrase technology. Despite my best ability to proofread, there is the potential that transcriptional errors may still occur from this process, and are completely unintentional.    Donald Kitchens, NP 06/30/19 2013

## 2019-07-03 ENCOUNTER — Encounter: Payer: Self-pay | Admitting: Family Medicine

## 2019-07-04 LAB — AEROBIC/ANAEROBIC CULTURE W GRAM STAIN (SURGICAL/DEEP WOUND)

## 2019-07-11 ENCOUNTER — Other Ambulatory Visit: Payer: Self-pay | Admitting: Family Medicine

## 2019-07-11 DIAGNOSIS — E785 Hyperlipidemia, unspecified: Secondary | ICD-10-CM

## 2019-07-11 DIAGNOSIS — E1165 Type 2 diabetes mellitus with hyperglycemia: Secondary | ICD-10-CM

## 2019-07-11 MED ORDER — ATORVASTATIN CALCIUM 40 MG PO TABS: 40.0000 mg | ORAL_TABLET | Freq: Every day | ORAL | 1 refills | Status: DC

## 2019-07-11 MED ORDER — OMEGA-3-ACID ETHYL ESTERS 1 G PO CAPS: 2.0000 g | ORAL_CAPSULE | Freq: Two times a day (BID) | ORAL | 1 refills | Status: DC

## 2019-07-11 MED ORDER — OZEMPIC (1 MG/DOSE) 2 MG/1.5ML ~~LOC~~ SOPN: 1.0000 mg | PEN_INJECTOR | SUBCUTANEOUS | 0 refills | Status: DC

## 2019-07-11 MED ORDER — JARDIANCE 10 MG PO TABS: 10.0000 mg | ORAL_TABLET | Freq: Every day | ORAL | 1 refills | Status: DC

## 2019-07-11 MED ORDER — PIOGLITAZONE HCL 30 MG PO TABS: 30.0000 mg | ORAL_TABLET | Freq: Every day | ORAL | 1 refills | Status: DC

## 2019-07-11 MED ORDER — METFORMIN HCL ER 500 MG PO TB24: 2000.0000 mg | ORAL_TABLET | Freq: Every day | ORAL | 1 refills | Status: DC

## 2019-07-12 LAB — COMPLETE METABOLIC PANEL WITH GFR
AG Ratio: 1.4 (calc) (ref 1.0–2.5)
ALT: 16 U/L (ref 9–46)
AST: 13 U/L (ref 10–40)
Albumin: 4.1 g/dL (ref 3.6–5.1)
Alkaline phosphatase (APISO): 64 U/L (ref 36–130)
BUN: 16 mg/dL (ref 7–25)
CO2: 26 mmol/L (ref 20–32)
Calcium: 9.4 mg/dL (ref 8.6–10.3)
Chloride: 101 mmol/L (ref 98–110)
Creat: 1.03 mg/dL (ref 0.60–1.35)
GFR, Est African American: 102 mL/min/{1.73_m2} (ref >=60)
GFR, Est Non African American: 88 mL/min/{1.73_m2} (ref >=60)
Globulin: 2.9 g/dL (calc) (ref 1.9–3.7)
Glucose, Bld: 287 mg/dL — ABNORMAL HIGH (ref 65–99)
Potassium: 5 mmol/L (ref 3.5–5.3)
Sodium: 134 mmol/L — ABNORMAL LOW (ref 135–146)
Total Bilirubin: 0.5 mg/dL (ref 0.2–1.2)
Total Protein: 7 g/dL (ref 6.1–8.1)

## 2019-07-12 LAB — HEMOGLOBIN A1C
Hgb A1c MFr Bld: 11 % of total Hgb — ABNORMAL HIGH (ref <5.7)
Mean Plasma Glucose: 269 (calc)
eAG (mmol/L): 14.9 (calc)

## 2019-10-01 ENCOUNTER — Ambulatory Visit
Admission: EM | Admit: 2019-10-01 | Discharge: 2019-10-01 | Disposition: A | Discharge status: Home / Self Care | Payer: 59 | Attending: Family Medicine | Admitting: Family Medicine

## 2019-10-01 ENCOUNTER — Other Ambulatory Visit: Payer: Self-pay

## 2019-10-01 DIAGNOSIS — L03211 Cellulitis of face: Secondary | ICD-10-CM

## 2019-10-01 MED ORDER — DOXYCYCLINE HYCLATE 100 MG PO CAPS: 100.0000 mg | ORAL_CAPSULE | Freq: Two times a day (BID) | ORAL | 0 refills | Status: DC

## 2019-10-01 MED ORDER — MUPIROCIN 2 % EX OINT: 1.0000 "application " | TOPICAL_OINTMENT | Freq: Two times a day (BID) | CUTANEOUS | 0 refills | Status: AC

## 2019-10-01 NOTE — ED Provider Notes (Signed)
[facility]    CSN: [id number] Arrival date & time: 10/01/19  9326  History   Chief Complaint Chief Complaint  Patient presents with  . Cellulitis    face   HPI  44 year old male with recurrent abscesses/skin infections presents with the above complaint.  Patient has what he believes is a staph infection on his face.  It is on the right side below his lower lip.  He states that the area is painful, red, and has been quite swollen.  He applied ice with improvement in swelling last night.  Pain 3/10 in severity.  He notes that it has been draining.  No medications or interventions tried.  No known inciting factor.  No relieving factors.  No fever or chills.  No other complaints.  Past Medical History:  Diagnosis Date  . Bulge of cervical disc without myelopathy    between c3-c4  . Diabetes mellitus without complication ([place])   . Folliculitis   . Hyperlipidemia   . Low testosterone   . Obesity   . Psoriasis   . Tarsal tunnel syndrome     Patient Active Problem List   Diagnosis Date Noted  . Recurrent boils 04/05/2018  . Morbid obesity ([place]) 04/05/2018  . DM (diabetes mellitus), type 2, uncontrolled ([place]) 12/29/2017  . Candidiasis 12/29/2017  . Low testosterone 02/12/2015  . Allergic dermatitis 02/12/2015  . Dyslipidemia 02/12/2015  . Allergic rhinitis 02/12/2015    Past Surgical History:  Procedure Laterality Date  . ANKLE SURGERY Bilateral    tarsal tunnel        Home Medications    Prior to Admission medications   Medication Sig Start Date End Date Taking? Authorizing Provider  atorvastatin (LIPITOR) 40 MG tablet Take 1 tablet (40 mg total) by mouth daily. 07/11/19  Yes Delsa Grana, PA-C  Blood Glucose Monitoring Suppl ([place]) W/DEVICE KIT Patient asked for VERIFLEX if that exists; staff found VERIO; please correct if that type exists; dx uncontrolled type 2 DM 02/13/15  Yes Lada, Satira Anis, MD  famotidine (PEPCID) 20 MG  tablet Take 20 mg by mouth daily.   Yes [provider]  fexofenadine (ALLEGRA) 180 MG tablet Take 1 tablet (180 mg total) by mouth daily. 11/16/18  Yes Lada, Satira Anis, MD  metFORMIN (GLUCOPHAGE-XR) 500 MG 24 hr tablet Take 4 tablets (2,000 mg total) by mouth daily. 07/11/19  Yes Delsa Grana, PA-C  omega-3 acid ethyl esters (LOVAZA) 1 g capsule Take 2 capsules (2 g total) by mouth 2 (two) times daily. 07/11/19  Yes Delsa Grana, PA-C  pioglitazone (ACTOS) 30 MG tablet Take 1 tablet (30 mg total) by mouth daily. For diabetes 07/11/19  Yes Delsa Grana, PA-C  POTASSIUM GLUCONATE PO Take 1 tablet by mouth daily.   Yes [provider]  Semaglutide, 1 MG/DOSE, (OZEMPIC, 1 MG/DOSE,) 2 MG/1.5ML SOPN Inject 1 mg into the skin once a week. 07/11/19  Yes Delsa Grana, PA-C  Semaglutide,0.25 or 0.5MG/DOS, 2 MG/1.5ML SOPN Inject 0.25 mg as directed once a week. 01/11/19  Yes [provider]  traMADol (ULTRAM) 50 MG tablet Take 1 tablet (50 mg total) by mouth every 12 (twelve) hours as needed. 06/29/19  Yes Karen Kitchens, NP  doxycycline (VIBRAMYCIN) 100 MG capsule Take 1 capsule (100 mg total) by mouth 2 (two) times daily. 10/01/19   Coral Spikes, DO  empagliflozin (JARDIANCE) 10 MG TABS tablet Take 10 mg by mouth daily. 07/11/19   Delsa Grana,  PA-C  glucose blood (ONETOUCH VERIO) test strip Check fingerstick blood sugars once a day on average; uncontrolled type 2 dm with hyperglycemia Patient not taking: Reported on 06/27/2019 02/13/15   Arnetha Courser, MD  mupirocin ointment (BACTROBAN) 2 % Apply 1 application topically 2 (two) times daily for 7 days. 10/01/19 10/08/19  Coral Spikes, DO    Family History Family History  Problem Relation Age of Onset  . Hypertension Mother   . Hyperlipidemia Mother   . Kidney disease Mother   . Diabetes Father   . Hypertension Father   . Hyperlipidemia Father   . Cancer Maternal Grandmother        not sure what kind  . Cancer Paternal  Grandmother        lung?  . Stroke Paternal Grandmother   . Heart disease Paternal Grandfather        heart failure    Social History Social History   Tobacco Use  . Smoking status: Never Smoker  . Smokeless tobacco: Never Used  Substance Use Topics  . Alcohol use: Yes    Comment: occasional  . Drug use: No     Allergies   Invokana [canagliflozin]   Review of Systems Review of Systems  Constitutional: Negative.   Skin:       Skin infection.    Physical Exam Triage Vital Signs ED Triage Vitals  Enc Vitals Group     BP 10/01/19 0834 128/89     Pulse Rate 10/01/19 0834 (!) 125     Resp --      Temp 10/01/19 0834 97.9 F (36.6 C)     Temp Source 10/01/19 0834 Oral     SpO2 10/01/19 0834 99 %     Weight 10/01/19 0828 275 lb (124.7 kg)     Height 10/01/19 0828 '6\' 2"'  (1.88 m)     Head Circumference --      Peak Flow --      Pain Score 10/01/19 0828 3     Pain Loc --      Pain Edu? --      Excl. in [place]? --    Updated Vital Signs BP 128/89 (BP Location: Left Arm)   Pulse (!) 125   Temp 97.9 F (36.6 C) (Oral)   Ht '6\' 2"'  (1.88 m)   Wt 124.7 kg   SpO2 99%   BMI 35.31 kg/m   Visual Acuity Right Eye Distance:   Left Eye Distance:   Bilateral Distance:    Right Eye Near:   Left Eye Near:    Bilateral Near:     Physical Exam Vitals and nursing note reviewed.  Constitutional:      General: He is not in acute distress.    Appearance: Normal appearance. He is obese. He is not ill-appearing.  HENT:     Head: Normocephalic and atraumatic.     Mouth/Throat:      Comments: Area of erythema and swelling noted.  Underlying induration.  No appreciable drainage at this time.  No fluctuance. Eyes:     General:        Right eye: No discharge.        Left eye: No discharge.     Conjunctiva/sclera: Conjunctivae normal.  Pulmonary:     Effort: Pulmonary effort is normal. No respiratory distress.  Neurological:     Mental Status: He is alert.  Psychiatric:          Mood and Affect: Mood normal.  Behavior: Behavior normal.    UC Treatments / Results  Labs (all labs ordered are listed, but only abnormal results are displayed) Labs Reviewed - No data to display  EKG   Radiology No results found.  Procedures Procedures (including critical care time)  Medications Ordered in UC Medications - No data to display  Initial Impression / Assessment and Plan / UC Course  I have reviewed the triage vital signs and the nursing notes.  Pertinent labs & imaging results that were available during my care of the patient were reviewed by me and considered in my medical decision making (see chart for details).    44 year old male presents with cellulitis and developing abscess.  Bactroban ointment and doxycycline as prescribed.  Final Clinical Impressions(s) / UC Diagnoses   Final diagnoses:  Cellulitis of face   Discharge Instructions   None    ED Prescriptions    Medication Sig Dispense Auth. Provider   mupirocin ointment (BACTROBAN) 2 % Apply 1 application topically 2 (two) times daily for 7 days. 22 g Jojo Geving G, DO   doxycycline (VIBRAMYCIN) 100 MG capsule Take 1 capsule (100 mg total) by mouth 2 (two) times daily. 14 capsule Thersa Salt G, DO     PDMP not reviewed this encounter.   Thersa Salt [place], [place] 10/01/19 [phone]

## 2019-10-01 NOTE — ED Triage Notes (Signed)
Pt presents with area on chin that was possibly a pimple. He states it has been present since Thursday. The area is getting worse, it is red, swollen and painful. Pt states he does have history of staph infections.

## 2019-11-14 ENCOUNTER — Other Ambulatory Visit: Payer: Self-pay

## 2019-11-14 ENCOUNTER — Ambulatory Visit: Payer: 59 | Admitting: Family Medicine

## 2019-11-14 ENCOUNTER — Encounter: Payer: Self-pay | Admitting: Family Medicine

## 2019-11-14 VITALS — BP 118/76 | HR 100 | Temp 97.5°F | Resp 14 | Ht 74.0 in | Wt 290.2 lb

## 2019-11-14 DIAGNOSIS — E1165 Type 2 diabetes mellitus with hyperglycemia: Secondary | ICD-10-CM

## 2019-11-14 DIAGNOSIS — E785 Hyperlipidemia, unspecified: Secondary | ICD-10-CM

## 2019-11-14 DIAGNOSIS — R252 Cramp and spasm: Secondary | ICD-10-CM

## 2019-11-14 DIAGNOSIS — Z5181 Encounter for therapeutic drug level monitoring: Secondary | ICD-10-CM

## 2019-11-14 LAB — POCT GLYCOSYLATED HEMOGLOBIN (HGB A1C): HbA1c POC (<> result, manual entry): >14 % (ref 4.0–5.6)

## 2019-11-14 MED ORDER — SEMAGLUTIDE(0.25 OR 0.5MG/DOS) 2 MG/1.5ML ~~LOC~~ SOPN: 0.2500 mg | PEN_INJECTOR | SUBCUTANEOUS | 0 refills | Status: DC

## 2019-11-14 MED ORDER — JARDIANCE 10 MG PO TABS: 10.0000 mg | ORAL_TABLET | Freq: Every day | ORAL | 1 refills | Status: DC

## 2019-11-14 MED ORDER — METFORMIN HCL ER 500 MG PO TB24: 1000.0000 mg | ORAL_TABLET | Freq: Two times a day (BID) | ORAL | 3 refills | Status: AC

## 2019-11-14 MED ORDER — OMEGA-3-ACID ETHYL ESTERS 1 G PO CAPS: 2.0000 g | ORAL_CAPSULE | Freq: Two times a day (BID) | ORAL | 1 refills | Status: AC

## 2019-11-14 MED ORDER — ATORVASTATIN CALCIUM 40 MG PO TABS: 40.0000 mg | ORAL_TABLET | Freq: Every day | ORAL | 3 refills | Status: AC

## 2019-11-14 MED ORDER — PIOGLITAZONE HCL 30 MG PO TABS: 30.0000 mg | ORAL_TABLET | Freq: Every day | ORAL | 1 refills | Status: DC

## 2019-11-14 NOTE — Patient Instructions (Signed)
Your A1C is over 14   I have sent in all med refills - we will see what insurance requires for use to get these medications for you, but you will likely need insulin to help get your diabetes under control.  I put in a referral back to [facility]  Your leg/feet/muscle cramps are most likely due to the uncontrolled diabetes.  Getting it under control will help.    You can try pushing low calorie fluids with electrolytes.  Take a calcium-magnesium-zinc supplement, eat a potassium rich diet.  Decrease carbs and sugars as much as you can in your diet.  And follow up with endocrinology as soon as you can.
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
## 2019-11-15 LAB — CBC WITH DIFFERENTIAL/PLATELET
Absolute Monocytes: 527 cells/uL (ref 200–950)
Basophils Absolute: 41 cells/uL (ref 0–200)
Basophils Relative: 0.5 %
Eosinophils Absolute: 113 cells/uL (ref 15–500)
Eosinophils Relative: 1.4 %
HCT: 45 % (ref 38.5–50.0)
Hemoglobin: 14.6 g/dL (ref 13.2–17.1)
Lymphs Abs: 2673 cells/uL (ref 850–3900)
MCH: 28.1 pg (ref 27.0–33.0)
MCHC: 32.4 g/dL (ref 32.0–36.0)
MCV: 86.7 fL (ref 80.0–100.0)
MPV: 10.9 fL (ref 7.5–12.5)
Monocytes Relative: 6.5 %
Neutro Abs: 4747 cells/uL (ref 1500–7800)
Neutrophils Relative %: 58.6 %
Platelets: 253 10*3/uL (ref 140–400)
RBC: 5.19 10*6/uL (ref 4.20–5.80)
RDW: 13.8 % (ref 11.0–15.0)
Total Lymphocyte: 33 %
WBC: 8.1 10*3/uL (ref 3.8–10.8)

## 2019-11-15 LAB — LIPID PANEL
Cholesterol: 140 mg/dL (ref <200)
HDL: 36 mg/dL — ABNORMAL LOW (ref >=40)
LDL Cholesterol (Calc): 66 mg/dL (calc)
Non-HDL Cholesterol (Calc): 104 mg/dL (calc) (ref <130)
Total CHOL/HDL Ratio: 3.9 (calc) (ref <5.0)
Triglycerides: 290 mg/dL — ABNORMAL HIGH (ref <150)

## 2019-11-15 LAB — COMPLETE METABOLIC PANEL WITH GFR
AG Ratio: 1.6 (calc) (ref 1.0–2.5)
ALT: 16 U/L (ref 9–46)
AST: 12 U/L (ref 10–40)
Albumin: 4.1 g/dL (ref 3.6–5.1)
Alkaline phosphatase (APISO): 63 U/L (ref 36–130)
BUN: 14 mg/dL (ref 7–25)
CO2: 27 mmol/L (ref 20–32)
Calcium: 9.3 mg/dL (ref 8.6–10.3)
Chloride: 103 mmol/L (ref 98–110)
Creat: 1.03 mg/dL (ref 0.60–1.35)
GFR, Est African American: 101 mL/min/{1.73_m2} (ref >=60)
GFR, Est Non African American: 87 mL/min/{1.73_m2} (ref >=60)
Globulin: 2.6 g/dL (calc) (ref 1.9–3.7)
Glucose, Bld: 246 mg/dL — ABNORMAL HIGH (ref 65–99)
Potassium: 4.5 mmol/L (ref 3.5–5.3)
Sodium: 136 mmol/L (ref 135–146)
Total Bilirubin: 0.5 mg/dL (ref 0.2–1.2)
Total Protein: 6.7 g/dL (ref 6.1–8.1)

## 2019-11-15 LAB — MICROALBUMIN, URINE: Microalb, Ur: 0.6 mg/dL

## 2019-11-15 LAB — MAGNESIUM: Magnesium: 1.8 mg/dL (ref 1.5–2.5)

## 2019-11-28 ENCOUNTER — Encounter: Payer: Self-pay | Admitting: Family Medicine

## 2019-11-28 NOTE — Progress Notes (Signed)
Name: Donald Baxter   MRN: [id number]    DOB: Nov 21, 1975   Date:11/29/2019       Progress Note  Subjective  Chief Complaint  Chief Complaint  Patient presents with  . Painful bump    under right chest, onset 1 week ago, painful    I connected with  [name]  on 11/29/19 at  8:40 AM EDT by a video enabled telemedicine application and verified that I am speaking with the correct person using two identifiers.  I discussed the limitations of evaluation and management by telemedicine and the availability of in person appointments. The patient expressed understanding and agreed to proceed. Staff also discussed with the patient that there may be a patient responsible charge related to this service. Patient Location: Home Provider Location: [facility] Additional Individuals present: none  HPI  Patient is a 44 year old male who recently saw Delsa Grana on 11/14/2019 with leg cramps, uncontrolled diabetes, and hyperlipidemia noted. On 4/13, he noted - I am in need of some antibiotics for some inflamed bumps. Is it possible to do a quick e-visit, and one was scheduled with me today.  He does have a history of recurrent boils noted on his problem list  He notes he has a history of staph infections in the past, often diagnosed with folliculitis, and beneath his left breast on the chest wall, is a painful red area surrounding a small bump.  He notes it tends to scab over, no obvious drainage.  The skin surrounding is firm, and definitely harder above.  It is quite painful.  He has tried some hot compresses, and has a mupirocin 2% cream that he has applied intermittently.  He denies any history of MRSA.  He denies any scaliness to this area.  More painful than itchy. He also denies any fevers, chills, no nausea or vomiting. He is not sure how his blood sugars have been recently, as he has not yet gotten his sensors back to help with his machine to monitor his sugars, and has been working with [facility]  to try to help with that.  He has returned to his medicines recently to help control.  We did discuss concerns with infection risk increasing when his sugars are not as well controlled.  It was also noted at his last visit the following: Patient has history of uncontrolled diabetes has had several barriers to care including loss of insurance, finances, severe phobia of needles hinders his compliance and medication options.  He did finally get insurance back after being out of medications and insurance for most of the past year, his A1c is greater than 14 with point-of-care A1c.  I discussed with him how he would need to start insulin but this was not an option to him due to his severe phobia of needles he would like to restart all of his other medications and does want to follow-up with endocrinology.   Patient Active Problem List   Diagnosis Date Noted  . Recurrent boils 04/05/2018  . Morbid obesity ([place]) 04/05/2018  . DM (diabetes mellitus), type 2, uncontrolled ([place]) 12/29/2017  . Candidiasis 12/29/2017  . Low testosterone 02/12/2015  . Allergic dermatitis 02/12/2015  . Dyslipidemia 02/12/2015  . Allergic rhinitis 02/12/2015    Past Surgical History:  Procedure Laterality Date  . ANKLE SURGERY Bilateral    tarsal tunnel     Family History  Problem Relation Age of Onset  . Hypertension Mother   . Hyperlipidemia Mother   .  Kidney disease Mother   . Diabetes Father   . Hypertension Father   . Hyperlipidemia Father   . Cancer Maternal Grandmother        not sure what kind  . Cancer Paternal Grandmother        lung?  . Stroke Paternal Grandmother   . Heart disease Paternal Grandfather        heart failure    Social History   Tobacco Use  . Smoking status: Never Smoker  . Smokeless tobacco: Never Used  Substance Use Topics  . Alcohol use: Yes    Comment: occasional     Current Outpatient Medications:  .  atorvastatin (LIPITOR) 40 MG tablet, Take 1 tablet (40 mg  total) by mouth daily., Disp: 90 tablet, Rfl: 3 .  empagliflozin (JARDIANCE) 10 MG TABS tablet, Take 10 mg by mouth daily., Disp: 90 tablet, Rfl: 1 .  fexofenadine (ALLEGRA) 180 MG tablet, Take 1 tablet (180 mg total) by mouth daily., Disp: , Rfl:  .  metFORMIN (GLUCOPHAGE-XR) 500 MG 24 hr tablet, Take 2 tablets (1,000 mg total) by mouth 2 (two) times daily with a meal., Disp: 360 tablet, Rfl: 3 .  omega-3 acid ethyl esters (LOVAZA) 1 g capsule, Take 2 capsules (2 g total) by mouth 2 (two) times daily., Disp: 360 capsule, Rfl: 1 .  pioglitazone (ACTOS) 30 MG tablet, Take 1 tablet (30 mg total) by mouth daily. For diabetes, Disp: 90 tablet, Rfl: 1 .  POTASSIUM GLUCONATE PO, Take 1 tablet by mouth daily., Disp: , Rfl:  .  Semaglutide,0.25 or 0.5MG /DOS, 2 MG/1.5ML SOPN, Inject 0.25 mg as directed once a week., Disp: 1 pen, Rfl: 0  Allergies  Allergen Reactions  . Invokana [Canagliflozin]     Yeast infection    With staff assistance, above reviewed with the patient today.   ROS: As per HPI, otherwise no specific complaints on a limited and focused system review   Objective  Virtual encounter, vitals not obtained.  Body mass index is 36.59 kg/m.  Physical Exam  Patient appears in NAD HENT: Head: Normocephalic and atraumatic.  Skin -patient had gynecomastia, with an erythematous area evident beneath the left breast in the crease area, the erythematous area was slightly larger than a golf ball sized, and was more ovoid.  On the camera, there was no obvious purulent discharge.  He noted the skin was harder in the superior portion of the redness and was tender.  (Likely indurated some) Breathing: Effort normal. No respiratory distress. Speaking in complete sentences Neurological: Pt is alert and oriented,  Speech is normal.  Psychiatric: Patient has a normal mood and affect, behavior is normal. Very appropriate with conversation, judgment and thought content normal.   No results found for  this or any previous visit (from the past 72 hour(s)).  PHQ2/9: Depression screen [facility] 2/9 11/29/2019 11/14/2019 06/27/2019 12/21/2018 12/14/2018  Decreased Interest 0 0 0 0 0  Down, Depressed, Hopeless 0 0 0 0 0  PHQ - 2 Score 0 0 0 0 0  Altered sleeping 1 0 0 0 0  Tired, decreased energy 1 0 0 0 0  Change in appetite 0 0 0 0 0  Feeling bad or failure about yourself  0 0 0 0 0  Trouble concentrating 0 0 0 0 0  Moving slowly or fidgety/restless 0 0 0 0 0  Suicidal thoughts 0 0 0 0 0  PHQ-9 Score 2 0 0 0 0  Difficult doing work/chores Not difficult  at all Not difficult at all Not difficult at all Not difficult at all Not difficult at all   PHQ-2/9 Result reviewed   Fall Risk: Fall Risk  11/29/2019 11/14/2019 06/27/2019 12/21/2018 12/14/2018  Falls in the past year? 0 0 0 0 0  Number falls in past yr: 0 0 0 0 0  Injury with Fall? 0 0 0 0 0  Follow up - - - - Falls evaluation completed     Assessment & Plan  1. Abscess Educated patient on this concern, and how there is likely induration at the site. Emphasized continuing with warm compresses several times a day to help, and may drain some externally noted. Recommended keeping the area clean, with warm soapy water use to help clean, and can loosely cover with a loose gauze type dressing (not to cover with a tight Band-Aid) to help lessen irritation. Given the concern for cellulitis component, an oral antibiotic was felt indicated, with Keflex prescribed.  Noted this does not cover MRSA, and the importance if this is not improving or progressing with any rapidity having follow-up immediately and will need to be seen in an office setting to help.  Also if has any fevers, chills, or feeling more ill, needs to be seen more urgently. Can continue the topical mupirocin product he has in addition, as that can only be helpful. Also recommended trying to avoid hand contact with the lesion, and washing his hands really good before applying the topical  entity. If this is not improving with the above, does need to follow-up, and may need a procedure like an I&D noted and he was understanding of that. - cephALEXin (KEFLEX) 500 MG capsule; Take 1 capsule (500 mg total) by mouth 3 (three) times daily for 7 days.  Dispense: 21 capsule; Refill: 0  2. Cellulitis of chest wall As above - cephALEXin (KEFLEX) 500 MG capsule; Take 1 capsule (500 mg total) by mouth 3 (three) times daily for 7 days.  Dispense: 21 capsule; Refill: 0  3. Uncontrolled type 2 diabetes mellitus with hyperglycemia [facility]) Did emphasize the importance of keeping his blood sugars controlled as well.  4. Morbid obesity ([place]) Does have significant gynecomastia, and did discuss concerns for potential fungal infections that can arise in these areas as well.  This appears more like a cellulitis/bacterial infection with a staph species likely and will manage as above.   I discussed the assessment and treatment plan with the patient. The patient was provided an opportunity to ask questions and all were answered. The patient agreed with the plan and demonstrated an understanding of the instructions.  Red flags and when to present for emergency care or RTC including fever, new/worsening/un-resolving symptoms reviewed with patient at time of visit.  The patient was advised to call back or seek an in-person evaluation if the symptoms worsen or if the condition fails to improve as anticipated.  I provided 15 minutes of non-face-to-face time during this encounter that included discussing at length patient's sx/history, pertinent pmhx, medications, treatment and follow up plan. This time also included the necessary documentation, orders, and chart review.

## 2019-11-29 ENCOUNTER — Encounter: Payer: Self-pay | Admitting: Internal Medicine

## 2019-11-29 ENCOUNTER — Other Ambulatory Visit: Payer: Self-pay

## 2019-11-29 ENCOUNTER — Telehealth (INDEPENDENT_AMBULATORY_CARE_PROVIDER_SITE_OTHER): Payer: 59 | Admitting: Internal Medicine

## 2019-11-29 VITALS — Ht 74.0 in | Wt 285.0 lb

## 2019-11-29 DIAGNOSIS — L03313 Cellulitis of chest wall: Secondary | ICD-10-CM

## 2019-11-29 DIAGNOSIS — L0291 Cutaneous abscess, unspecified: Secondary | ICD-10-CM

## 2019-11-29 DIAGNOSIS — E1165 Type 2 diabetes mellitus with hyperglycemia: Secondary | ICD-10-CM | POA: Diagnosis not present

## 2019-11-29 MED ORDER — CEPHALEXIN 500 MG PO CAPS: 500.0000 mg | ORAL_CAPSULE | Freq: Three times a day (TID) | ORAL | 0 refills | Status: AC

## 2019-12-20 ENCOUNTER — Encounter: Payer: Self-pay | Admitting: Family Medicine

## 2019-12-28 ENCOUNTER — Telehealth: Payer: Self-pay | Admitting: Family Medicine

## 2019-12-28 NOTE — Telephone Encounter (Signed)
Donald Baxter w/ CoverMyMeds requesting a call back inquiring about an error message made on request of Ozempic

## 2020-01-10 ENCOUNTER — Other Ambulatory Visit: Payer: Self-pay

## 2020-01-10 ENCOUNTER — Telehealth: Payer: Self-pay | Admitting: Family Medicine

## 2020-01-10 DIAGNOSIS — E1165 Type 2 diabetes mellitus with hyperglycemia: Secondary | ICD-10-CM

## 2020-01-10 MED ORDER — SEMAGLUTIDE(0.25 OR 0.5MG/DOS) 2 MG/1.5ML ~~LOC~~ SOPN: 0.2500 mg | PEN_INJECTOR | SUBCUTANEOUS | 0 refills | Status: DC

## 2020-01-10 MED ORDER — DEXCOM G6 SENSOR MISC: 1.0000 | Freq: Every day | 1 refills | Status: DC

## 2020-01-10 NOTE — Telephone Encounter (Signed)
Copied from [place] [id number]. Topic: General - Other >> Jan 10, 2020 11:59 AM Keene Breath wrote: Reason for CRM: Patient is calling regarding three medications that he believes needs a PA.  He stated that the pharmacy said they have faxed over these requests with no response several times.  (Ozempic, Diabetes tester kit and sensor).  Please call patient to discuss at [phone]

## 2020-01-10 NOTE — Telephone Encounter (Signed)
Sent prescriptions through to pharmacy waiting on PA approval

## 2020-01-15 MED ORDER — OZEMPIC (0.25 OR 0.5 MG/DOSE) 2 MG/1.5ML ~~LOC~~ SOPN: 0.5000 mg | PEN_INJECTOR | SUBCUTANEOUS | 1 refills | Status: DC

## 2020-01-15 NOTE — Telephone Encounter (Signed)
Believe pt was restarting ozempic and only able to tolerate 0.5 mg dose weekly, had SE with 1 mg ozempic.  He has requested Dexcom meter but we have explained that it may likely only be covered by insurance if prescribed from specialist.  We have however still tried to order this for the pt, per his request.   Referred back to Endocrine in March 2021 with last OV - per referral notes on 11/27/2019- Established patient of Malissa Hippo, NP. No referral needed. Patient has been contacted by our office to schedule follow up appt. Please ask patient to contact our office @ [phone] to schedule a follow up appt. Thanks!" [facility] said about 1 hour ago  Pt is due for f/up with endo or with PCP due to last A1C > 14  Pt will be called regarding meds, refills and f/up    ICD-10-CM   1. Uncontrolled type 2 diabetes mellitus with hyperglycemia (HCC)  E11.65 Semaglutide,0.25 or 0.5MG /DOS, (OZEMPIC, 0.25 OR 0.5 MG/DOSE,) 2 MG/1.5ML SOPN    DISCONTINUED: Semaglutide,0.25 or 0.5MG /DOS, 2 MG/1.5ML SOPN     Delsa Grana, PA-C

## 2020-01-15 NOTE — Addendum Note (Signed)
Addended by: Delsa Grana on: 01/15/2020 10:54 PM   Modules accepted: Orders

## 2020-03-25 ENCOUNTER — Other Ambulatory Visit: Payer: Self-pay

## 2020-03-25 ENCOUNTER — Ambulatory Visit
Admission: RE | Admit: 2020-03-25 | Discharge: 2020-03-25 | Disposition: A | Discharge status: Home / Self Care | Payer: 59 | Source: Ambulatory Visit | Attending: Family Medicine | Admitting: Family Medicine

## 2020-03-25 VITALS — BP 106/77 | HR 82 | Temp 98.0°F | Resp 18 | Ht 74.0 in | Wt 275.0 lb

## 2020-03-25 DIAGNOSIS — L255 Unspecified contact dermatitis due to plants, except food: Secondary | ICD-10-CM | POA: Diagnosis not present

## 2020-03-25 MED ORDER — CLOBETASOL PROPIONATE 0.05 % EX OINT: 1.0000 "application " | TOPICAL_OINTMENT | Freq: Two times a day (BID) | CUTANEOUS | 0 refills | Status: DC

## 2020-03-25 MED ORDER — HYDROXYZINE HCL 25 MG PO TABS: 25.0000 mg | ORAL_TABLET | Freq: Three times a day (TID) | ORAL | 0 refills | Status: DC | PRN

## 2020-03-25 NOTE — ED Provider Notes (Signed)
[facility]    CSN: [id number] Arrival date & time: 03/25/20  0844  History   Chief Complaint Chief Complaint  Patient presents with  . Poison Karlene Einstein   HPI   44 year old male presents with the above complaint.  Patient reports that he got into some poison ivy approximately 2 weeks ago.  He states that he is still bothered by the rash despite using over-the-counter Benadryl and calamine lotion.  He reports itching which is worse at night.  No current vesicles.  No reports of additional exposure.  No other associated symptoms.  No other complaints.  Past Medical History:  Diagnosis Date  . Bulge of cervical disc without myelopathy    between c3-c4  . Diabetes mellitus without complication ([place])   . Folliculitis   . Hyperlipidemia   . Low testosterone   . Obesity   . Psoriasis   . Tarsal tunnel syndrome     Patient Active Problem List   Diagnosis Date Noted  . Recurrent boils 04/05/2018  . Morbid obesity ([place]) 04/05/2018  . DM (diabetes mellitus), type 2, uncontrolled ([place]) 12/29/2017  . Candidiasis 12/29/2017  . Low testosterone 02/12/2015  . Allergic dermatitis 02/12/2015  . Dyslipidemia 02/12/2015  . Allergic rhinitis 02/12/2015    Past Surgical History:  Procedure Laterality Date  . ANKLE SURGERY Bilateral    tarsal tunnel        Home Medications    Prior to Admission medications   Medication Sig Start Date End Date Taking? Authorizing Provider  atorvastatin (LIPITOR) 40 MG tablet Take 1 tablet (40 mg total) by mouth daily. 11/14/19  Yes Delsa Grana, PA-C  Continuous Blood Gluc Sensor (DEXCOM G6 SENSOR) MISC 1 Device by Does not apply route daily. Change every 10 days 01/10/20  Yes Delsa Grana, PA-C  empagliflozin (JARDIANCE) 10 MG TABS tablet Take 10 mg by mouth daily. 11/14/19  Yes Delsa Grana, PA-C  fexofenadine (ALLEGRA) 180 MG tablet Take 1 tablet (180 mg total) by mouth daily. 11/16/18  Yes Lada, Satira Anis, MD  metFORMIN (GLUCOPHAGE-XR) 500 MG  24 hr tablet Take 2 tablets (1,000 mg total) by mouth 2 (two) times daily with a meal. 11/14/19  Yes Delsa Grana, PA-C  omega-3 acid ethyl esters (LOVAZA) 1 g capsule Take 2 capsules (2 g total) by mouth 2 (two) times daily. 11/14/19  Yes Delsa Grana, PA-C  pioglitazone (ACTOS) 30 MG tablet Take 1 tablet (30 mg total) by mouth daily. For diabetes 11/14/19  Yes Delsa Grana, PA-C  Semaglutide,0.25 or 0.5MG /DOS, (OZEMPIC, 0.25 OR 0.5 MG/DOSE,) 2 MG/1.5ML SOPN Inject 0.5 mg into the skin once a week. 01/15/20  Yes Delsa Grana, PA-C  clobetasol ointment (TEMOVATE) 3.53 % Apply 1 application topically 2 (two) times daily. Do not use more than 2 weeks consecutively. 03/25/20   Coral Spikes, DO  hydrOXYzine (ATARAX/VISTARIL) 25 MG tablet Take 1 tablet (25 mg total) by mouth every 8 (eight) hours as needed. 03/25/20   Coral Spikes, DO  POTASSIUM GLUCONATE PO Take 1 tablet by mouth daily.  03/25/20  [provider]    Family History Family History  Problem Relation Age of Onset  . Hypertension Mother   . Hyperlipidemia Mother   . Kidney disease Mother   . Diabetes Father   . Hypertension Father   . Hyperlipidemia Father   . Cancer Maternal Grandmother        not sure what kind  . Cancer Paternal Grandmother  lung?  . Stroke Paternal Grandmother   . Heart disease Paternal Grandfather        heart failure    Social History Social History   Tobacco Use  . Smoking status: Never Smoker  . Smokeless tobacco: Never Used  Vaping Use  . Vaping Use: Never used  Substance Use Topics  . Alcohol use: Yes    Comment: occasional  . Drug use: No     Allergies   Invokana [canagliflozin]   Review of Systems Review of Systems  Skin: Positive for rash.   Physical Exam Triage Vital Signs ED Triage Vitals  Enc Vitals Group     BP 03/25/20 0906 106/77     Pulse Rate 03/25/20 0906 82     Resp 03/25/20 0906 18     Temp 03/25/20 0906 98 F (36.7 C)     Temp Source 03/25/20 0906 Oral      SpO2 03/25/20 0906 98 %     Weight 03/25/20 0904 275 lb (124.7 kg)     Height 03/25/20 0904 6\' 2"  (1.88 m)     Head Circumference --      Peak Flow --      Pain Score 03/25/20 0904 0     Pain Loc --      Pain Edu? --      Excl. in [place]? --    Updated Vital Signs BP 106/77 (BP Location: Left Arm)   Pulse 82   Temp 98 F (36.7 C) (Oral)   Resp 18   Ht 6\' 2"  (1.88 m)   Wt 124.7 kg   SpO2 98%   BMI 35.31 kg/m   Visual Acuity Right Eye Distance:   Left Eye Distance:   Bilateral Distance:    Right Eye Near:   Left Eye Near:    Bilateral Near:     Physical Exam Vitals and nursing note reviewed.  Constitutional:      General: He is not in acute distress.    Appearance: Normal appearance. He is obese. He is not ill-appearing.  HENT:     Head: Normocephalic and atraumatic.  Eyes:     General:        Right eye: No discharge.        Left eye: No discharge.     Conjunctiva/sclera: Conjunctivae normal.  Pulmonary:     Effort: Pulmonary effort is normal. No respiratory distress.  Skin:    Comments: Lower extremities as well as his upper extremities with erythematous rash with eschar.  No vesicles at this time.  Neurological:     Mental Status: He is alert.  Psychiatric:        Mood and Affect: Mood normal.        Behavior: Behavior normal.    UC Treatments / Results  Labs (all labs ordered are listed, but only abnormal results are displayed) Labs Reviewed - No data to display  EKG   Radiology No results found.  Procedures Procedures (including critical care time)  Medications Ordered in UC Medications - No data to display  Initial Impression / Assessment and Plan / UC Course  I have reviewed the triage vital signs and the nursing notes.  Pertinent labs & imaging results that were available during my care of the patient were reviewed by me and considered in my medical decision making (see chart for details).    44 year old male presents with dermatitis  secondary to poison ivy.  Given uncontrolled diabetes, I am placing him  on clobetasol as opposed to oral corticosteroids. Atarax for itching.   Final Clinical Impressions(s) / UC Diagnoses   Final diagnoses:  Dermatitis due to plants, including poison ivy, sumac, and oak   Discharge Instructions   None    ED Prescriptions    Medication Sig Dispense Auth. Provider   clobetasol ointment (TEMOVATE) 1.85 % Apply 1 application topically 2 (two) times daily. Do not use more than 2 weeks consecutively. 60 g Monzerat Handler G, DO   hydrOXYzine (ATARAX/VISTARIL) 25 MG tablet Take 1 tablet (25 mg total) by mouth every 8 (eight) hours as needed. 30 tablet Coral Spikes, DO     PDMP not reviewed this encounter.   Coral Spikes, DO 03/25/20 1001

## 2020-03-25 NOTE — ED Triage Notes (Signed)
Patient states that he got in to some poison ivy/oak x 2 weeks ago. States that it is just not improving. States that he has tried benadryl and calamine lotion.

## 2020-03-26 ENCOUNTER — Other Ambulatory Visit: Payer: Self-pay | Admitting: Family Medicine

## 2020-03-26 DIAGNOSIS — L03811 Cellulitis of head [any part, except face]: Secondary | ICD-10-CM

## 2020-09-25 ENCOUNTER — Ambulatory Visit: Payer: 59 | Admitting: Internal Medicine

## 2020-10-09 ENCOUNTER — Other Ambulatory Visit: Payer: Self-pay | Admitting: Family Medicine

## 2020-10-09 DIAGNOSIS — E785 Hyperlipidemia, unspecified: Secondary | ICD-10-CM

## 2020-10-14 ENCOUNTER — Other Ambulatory Visit: Payer: Self-pay | Admitting: Family Medicine

## 2020-10-14 DIAGNOSIS — E1165 Type 2 diabetes mellitus with hyperglycemia: Secondary | ICD-10-CM

## 2020-10-16 ENCOUNTER — Other Ambulatory Visit: Payer: Self-pay | Admitting: Family Medicine

## 2020-10-16 DIAGNOSIS — E1165 Type 2 diabetes mellitus with hyperglycemia: Secondary | ICD-10-CM

## 2020-10-16 DIAGNOSIS — E785 Hyperlipidemia, unspecified: Secondary | ICD-10-CM

## 2020-10-16 NOTE — Telephone Encounter (Signed)
lvm to schedule appt with a provider on staff

## 2021-07-27 ENCOUNTER — Other Ambulatory Visit: Payer: Self-pay

## 2021-07-27 ENCOUNTER — Ambulatory Visit
Admission: EM | Admit: 2021-07-27 | Discharge: 2021-07-27 | Disposition: A | Discharge status: Home / Self Care | Payer: 59 | Attending: Physician Assistant | Admitting: Physician Assistant

## 2021-07-27 DIAGNOSIS — R0982 Postnasal drip: Secondary | ICD-10-CM

## 2021-07-27 DIAGNOSIS — J069 Acute upper respiratory infection, unspecified: Secondary | ICD-10-CM | POA: Diagnosis not present

## 2021-07-27 DIAGNOSIS — J029 Acute pharyngitis, unspecified: Secondary | ICD-10-CM

## 2021-07-27 DIAGNOSIS — R051 Acute cough: Secondary | ICD-10-CM

## 2021-07-27 MED ORDER — IPRATROPIUM BROMIDE 0.06 % NA SOLN: 2.0000 | Freq: Four times a day (QID) | NASAL | 0 refills | Status: DC

## 2021-07-27 MED ORDER — PSEUDOEPH-BROMPHEN-DM 30-2-10 MG/5ML PO SYRP: 10.0000 mL | ORAL_SOLUTION | Freq: Four times a day (QID) | ORAL | 0 refills | Status: AC | PRN

## 2021-07-27 MED ORDER — LIDOCAINE VISCOUS HCL 2 % MT SOLN: 15.0000 mL | OROMUCOSAL | 0 refills | Status: DC | PRN

## 2021-07-27 NOTE — ED Triage Notes (Signed)
Pt c/o sore throat, nasal congestion, and cough. Donald Baxter. Pt has not been around anyone who has been sick. Pt states that he feels like his sore throat came due to nasal drainage. Pt has been taking OTC dayquil and denies taking tylenol. Pt declines being tested for covid/flu and wants to have symptoms treated.

## 2021-07-27 NOTE — ED Provider Notes (Signed)
[facility]    CSN: [id number] Arrival date & time: 07/27/21  0809      History   Chief Complaint Chief Complaint  Patient presents with   Cough   Nasal Congestion         HPI Donald Baxter is a 45 y.o. male presenting for approximately 1 week history of sore throat, postnasal drainage, nasal congestion, cough.  Also reports fatigue.  No history of fever, body aches, breathing occultly, vomiting or diarrhea.  Patient says he has been taking over-the-counter DayQuil/NyQuil and Tylenol and does not feel any better.  States his throat seems to be hurting worse.  Patient does not want to be tested for COVID or flu and would like something different to help with his symptoms.  No known COVID or flu exposure.  Patient history of diabetes and obesity.  HPI  Past Medical History:  Diagnosis Date   Bulge of cervical disc without myelopathy    between c3-c4   Diabetes mellitus without complication (HCC)    Folliculitis    Hyperlipidemia    Low testosterone    Obesity    Psoriasis    Tarsal tunnel syndrome     Patient Active Problem List   Diagnosis Date Noted   Recurrent boils 04/05/2018   Morbid obesity ([place]) 04/05/2018   DM (diabetes mellitus), type 2, uncontrolled 12/29/2017   Candidiasis 12/29/2017   Low testosterone 02/12/2015   Allergic dermatitis 02/12/2015   Dyslipidemia 02/12/2015   Allergic rhinitis 02/12/2015    Past Surgical History:  Procedure Laterality Date   ANKLE SURGERY Bilateral    tarsal tunnel        Home Medications    Prior to Admission medications   Medication Sig Start Date End Date Taking? Authorizing Provider  atorvastatin (LIPITOR) 40 MG tablet Take 1 tablet (40 mg total) by mouth daily. 11/14/19  Yes Delsa Grana, PA-C  brompheniramine-pseudoephedrine-DM 30-2-10 MG/5ML syrup Take 10 mLs by mouth 4 (four) times daily as needed for up to 7 days. 07/27/21 08/03/21 Yes Laurene Footman B, PA-C  Continuous Blood Gluc Sensor  (DEXCOM G6 SENSOR) MISC 1 Device by Does not apply route daily. Change every 10 days 01/10/20  Yes Delsa Grana, PA-C  empagliflozin (JARDIANCE) 10 MG TABS tablet Take 10 mg by mouth daily. 11/14/19  Yes Delsa Grana, PA-C  fexofenadine (ALLEGRA) 180 MG tablet Take 1 tablet (180 mg total) by mouth daily. 11/16/18  Yes Lada, Satira Anis, MD  ipratropium (ATROVENT) 0.06 % nasal spray Place 2 sprays into both nostrils 4 (four) times daily. 07/27/21  Yes Laurene Footman B, PA-C  lidocaine (XYLOCAINE) 2 % solution Use as directed 15 mLs in the mouth or throat every 3 (three) hours as needed for mouth pain (swish and spit). 07/27/21  Yes Danton Clap, PA-C  metFORMIN (GLUCOPHAGE-XR) 500 MG 24 hr tablet Take 2 tablets (1,000 mg total) by mouth 2 (two) times daily with a meal. 11/14/19  Yes Delsa Grana, PA-C  omega-3 acid ethyl esters (LOVAZA) 1 g capsule Take 2 capsules (2 g total) by mouth 2 (two) times daily. 11/14/19  Yes Delsa Grana, PA-C  pioglitazone (ACTOS) 30 MG tablet Take 1 tablet (30 mg total) by mouth daily. For diabetes 11/14/19  Yes Delsa Grana, PA-C  Semaglutide,0.25 or 0.5MG /DOS, (OZEMPIC, 0.25 OR 0.5 MG/DOSE,) 2 MG/1.5ML SOPN Inject 0.5 mg into the skin once a week. 01/15/20  Yes Delsa Grana, PA-C  clobetasol ointment (TEMOVATE) 0.07 % Apply 1 application topically 2 (  two) times daily. Do not use more than 2 weeks consecutively. 03/25/20   Coral Spikes, DO  POTASSIUM GLUCONATE PO Take 1 tablet by mouth daily.  03/25/20  [provider]    Family History Family History  Problem Relation Age of Onset   Hypertension Mother    Hyperlipidemia Mother    Kidney disease Mother    Diabetes Father    Hypertension Father    Hyperlipidemia Father    Cancer Maternal Grandmother        not sure what kind   Cancer Paternal Grandmother        lung?   Stroke Paternal Grandmother    Heart disease Paternal Grandfather        heart failure    Social History Social History   Tobacco Use    Smoking status: Never   Smokeless tobacco: Never  Vaping Use   Vaping Use: Never used  Substance Use Topics   Alcohol use: Yes    Comment: occasional   Drug use: No     Allergies   Invokana [canagliflozin]   Review of Systems Review of Systems  Constitutional:  Positive for fatigue. Negative for fever.  HENT:  Positive for congestion, postnasal drip, rhinorrhea, sinus pressure and sore throat. Negative for sinus pain.   Respiratory:  Positive for cough. Negative for shortness of breath.   Gastrointestinal:  Negative for abdominal pain, diarrhea, nausea and vomiting.  Musculoskeletal:  Negative for myalgias.  Neurological:  Negative for weakness, light-headedness and headaches.  Hematological:  Negative for adenopathy.    Physical Exam Triage Vital Signs ED Triage Vitals  Enc Vitals Group     BP      Pulse      Resp      Temp      Temp src      SpO2      Weight      Height      Head Circumference      Peak Flow      Pain Score      Pain Loc      Pain Edu?      Excl. in [place]?    No data found.  Updated Vital Signs BP 120/84 (BP Location: Left Arm)   Pulse 93   Temp 98.1 F (36.7 C) (Oral)   Resp 18   Ht 6\' 2"  (1.88 m)   Wt 275 lb (124.7 kg)   SpO2 98%   BMI 35.31 kg/m     Physical Exam Vitals and nursing note reviewed.  Constitutional:      General: He is not in acute distress.    Appearance: Normal appearance. He is well-developed. He is ill-appearing.  HENT:     Head: Normocephalic and atraumatic.     Nose: Congestion present.     Mouth/Throat:     Mouth: Mucous membranes are moist.     Pharynx: Oropharynx is clear. Posterior oropharyngeal erythema (with clear PND) present.  Eyes:     General: No scleral icterus.    Conjunctiva/sclera: Conjunctivae normal.  Cardiovascular:     Rate and Rhythm: Normal rate and regular rhythm.     Heart sounds: Normal heart sounds.  Pulmonary:     Effort: Pulmonary effort is normal. No respiratory distress.      Breath sounds: Normal breath sounds.  Musculoskeletal:     Cervical back: Neck supple.  Skin:    General: Skin is warm and dry.     Capillary  Refill: Capillary refill takes less than 2 seconds.  Neurological:     General: No focal deficit present.     Mental Status: He is alert. Mental status is at baseline.     Motor: No weakness.     Coordination: Coordination normal.     Gait: Gait normal.  Psychiatric:        Mood and Affect: Mood normal.        Behavior: Behavior normal.        Thought Content: Thought content normal.     UC Treatments / Results  Labs (all labs ordered are listed, but only abnormal results are displayed) Labs Reviewed - No data to display  EKG   Radiology No results found.  Procedures Procedures (including critical care time)  Medications Ordered in UC Medications - No data to display  Initial Impression / Assessment and Plan / UC Course  I have reviewed the triage vital signs and the nursing notes.  Pertinent labs & imaging results that were available during my care of the patient were reviewed by me and considered in my medical decision making (see chart for details).  46 year old male presenting for 1 week history of sore throat, postnasal drainage, cough, and congestion.  Vitals all normal and stable.  He is ill-appearing but nontoxic.  Exam reveals nasal congestion with clearish rhinorrhea, posterior pharyngeal erythema with clear postnasal drainage.  Chest clear to auscultation heart regular rate and rhythm.  Advised patient symptoms consistent with viral upper respiratory infection.  We will have him switch to Bromfed-DM, Atrovent nasal spray and viscous lidocaine.  Patient has not been using a nasal spray.  Discussed with him how to act greatly use this.  He says when he uses it the medication goes into his mouth.  Advised him to also increase rest and fluids.  Ibuprofen for discomfort.  Reviewed return and ER precautions.  Work note  given.   Final Clinical Impressions(s) / UC Diagnoses   Final diagnoses:  Viral upper respiratory tract infection  Sore throat  Acute cough  Post-nasal drip     Discharge Instructions      URI/COLD SYMPTOMS: Your exam today is consistent with a viral illness. Antibiotics are not indicated at this time. Use medications as directed, including cough syrup, nasal saline, and decongestants. Your symptoms should improve over the next few days and resolve within 7-10 days. Increase rest and fluids. F/u if symptoms worsen or predominate such as sore throat, ear pain, productive cough, shortness of breath, or if you develop high fevers or worsening fatigue over the next several days.       ED Prescriptions     Medication Sig Dispense Auth. Provider   brompheniramine-pseudoephedrine-DM 30-2-10 MG/5ML syrup Take 10 mLs by mouth 4 (four) times daily as needed for up to 7 days. 150 mL Laurene Footman B, PA-C   ipratropium (ATROVENT) 0.06 % nasal spray Place 2 sprays into both nostrils 4 (four) times daily. 15 mL Laurene Footman B, PA-C   lidocaine (XYLOCAINE) 2 % solution Use as directed 15 mLs in the mouth or throat every 3 (three) hours as needed for mouth pain (swish and spit). 100 mL Danton Clap, PA-C      PDMP not reviewed this encounter.   Danton Clap, PA-C 07/27/21 [phone]

## 2021-07-27 NOTE — Discharge Instructions (Addendum)
URI/COLD SYMPTOMS: Your exam today is consistent with a viral illness. Antibiotics are not indicated at this time. Use medications as directed, including cough syrup, nasal saline, and decongestants. Your symptoms should improve over the next few days and resolve within 7-10 days. Increase rest and fluids. F/u if symptoms worsen or predominate such as sore throat, ear pain, productive cough, shortness of breath, or if you develop high fevers or worsening fatigue over the next several days.    

## 2022-04-16 ENCOUNTER — Encounter: Payer: Self-pay | Admitting: Gastroenterology

## 2022-04-16 NOTE — H&P (Signed)
Pre-Procedure H&P   Patient ID: Donald Baxter is a 46 y.o. male.  Gastroenterology Provider: Annamaria Helling, DO  Referring Provider: Gaetano Net, NP PCP: Delsa Grana, PA-C  Date: 04/17/2022  HPI Donald Baxter is a 46 y.o. male who presents today for Colonoscopy for Initial screening colonoscopy.  Patient with every other day bowel movement without melena hematochezia diarrhea or constipation.  He is on Ozempic which has been held for this procedure- last dose 04/05/22.  No family history of colorectal cancer or colon polyps   Past Medical History:  Diagnosis Date   Bulge of cervical disc without myelopathy    between c3-c4   Diabetes mellitus without complication (HCC)    Folliculitis    Hyperlipidemia    Low testosterone    Obesity    Psoriasis    Tarsal tunnel syndrome     Past Surgical History:  Procedure Laterality Date   ANKLE SURGERY Bilateral    tarsal tunnel     Family History No h/o GI disease or malignancy  Review of Systems  Constitutional:  Negative for activity change, appetite change, chills, diaphoresis, fatigue, fever and unexpected weight change.  HENT:  Negative for trouble swallowing and voice change.   Respiratory:  Negative for shortness of breath and wheezing.   Cardiovascular:  Negative for chest pain, palpitations and leg swelling.  Gastrointestinal:  Negative for abdominal distention, abdominal pain, anal bleeding, blood in stool, constipation, diarrhea, nausea and vomiting.  Musculoskeletal:  Negative for arthralgias and myalgias.  Skin:  Negative for color change and pallor.  Neurological:  Negative for dizziness, syncope and weakness.  Psychiatric/Behavioral:  Negative for confusion. The patient is not nervous/anxious.   All other systems reviewed and are negative.    Medications No current facility-administered medications on file prior to encounter.   Current Outpatient Medications on File Prior to Encounter   Medication Sig Dispense Refill   atorvastatin (LIPITOR) 40 MG tablet Take 1 tablet (40 mg total) by mouth daily. 90 tablet 3   clobetasol ointment (TEMOVATE) 0.09 % Apply 1 application topically 2 (two) times daily. Do not use more than 2 weeks consecutively. 60 g 0   Continuous Blood Gluc Sensor (DEXCOM G6 SENSOR) MISC 1 Device by Does not apply route daily. Change every 10 days 6 each 1   empagliflozin (JARDIANCE) 10 MG TABS tablet Take 10 mg by mouth daily. 90 tablet 1   fexofenadine (ALLEGRA) 180 MG tablet Take 1 tablet (180 mg total) by mouth daily.     ipratropium (ATROVENT) 0.06 % nasal spray Place 2 sprays into both nostrils 4 (four) times daily. 15 mL 0   lidocaine (XYLOCAINE) 2 % solution Use as directed 15 mLs in the mouth or throat every 3 (three) hours as needed for mouth pain (swish and spit). 100 mL 0   metFORMIN (GLUCOPHAGE-XR) 500 MG 24 hr tablet Take 2 tablets (1,000 mg total) by mouth 2 (two) times daily with a meal. 360 tablet 3   omega-3 acid ethyl esters (LOVAZA) 1 g capsule Take 2 capsules (2 g total) by mouth 2 (two) times daily. 360 capsule 1   pioglitazone (ACTOS) 30 MG tablet Take 1 tablet (30 mg total) by mouth daily. For diabetes 90 tablet 1   Semaglutide,0.25 or 0.'5MG'$ /DOS, (OZEMPIC, 0.25 OR 0.5 MG/DOSE,) 2 MG/1.5ML SOPN Inject 0.5 mg into the skin once a week. 3 pen 1   [DISCONTINUED] POTASSIUM GLUCONATE PO Take 1 tablet by mouth daily.  Pertinent medications related to GI and procedure were reviewed by me with the patient prior to the procedure   Current Facility-Administered Medications:    0.9 %  sodium chloride infusion, , Intravenous, Continuous, Annamaria Helling, DO, Last Rate: 20 mL/hr at 04/17/22 0719, New Bag at 04/17/22 0719      Allergies  Allergen Reactions   Invokana [Canagliflozin]     Yeast infection   Allergies were reviewed by me prior to the procedure  Objective   Body mass index is 37.88 kg/m. Vitals:   04/17/22 0700  BP:  (!) 131/90  Pulse: 73  Resp: 16  Temp: (!) 96.3 F (35.7 C)  TempSrc: Temporal  SpO2: 100%  Weight: 133.8 kg  Height: '6\' 2"'$  (1.88 m)     Physical Exam Vitals and nursing note reviewed.  Constitutional:      General: He is not in acute distress.    Appearance: Normal appearance. He is obese. He is not ill-appearing, toxic-appearing or diaphoretic.  HENT:     Head: Normocephalic and atraumatic.     Nose: Nose normal.     Mouth/Throat:     Mouth: Mucous membranes are moist.     Pharynx: Oropharynx is clear.  Eyes:     General: No scleral icterus.    Extraocular Movements: Extraocular movements intact.  Cardiovascular:     Rate and Rhythm: Normal rate and regular rhythm.     Heart sounds: Normal heart sounds. No murmur heard.    No friction rub. No gallop.  Pulmonary:     Effort: Pulmonary effort is normal. No respiratory distress.     Breath sounds: Normal breath sounds. No wheezing, rhonchi or rales.  Abdominal:     General: Bowel sounds are normal. There is no distension.     Palpations: Abdomen is soft.     Tenderness: There is no abdominal tenderness. There is no guarding or rebound.  Musculoskeletal:     Cervical back: Neck supple.     Right lower leg: No edema.     Left lower leg: No edema.  Skin:    General: Skin is warm and dry.     Coloration: Skin is not jaundiced or pale.  Neurological:     General: No focal deficit present.     Mental Status: He is alert and oriented to person, place, and time. Mental status is at baseline.  Psychiatric:        Mood and Affect: Mood normal.        Behavior: Behavior normal.        Thought Content: Thought content normal.        Judgment: Judgment normal.      Assessment:  Donald Baxter is a 46 y.o. male  who presents today for Colonoscopy for Initial screening colonoscopy.  Plan:  Colonoscopy with possible intervention today  Colonoscopy with possible biopsy, control of bleeding, polypectomy, and  interventions as necessary has been discussed with the patient/patient representative. Informed consent was obtained from the patient/patient representative after explaining the indication, nature, and risks of the procedure including but not limited to death, bleeding, perforation, missed neoplasm/lesions, cardiorespiratory compromise, and reaction to medications. Opportunity for questions was given and appropriate answers were provided. Patient/patient representative has verbalized understanding is amenable to undergoing the procedure.   Annamaria Helling, DO  [facility]  Portions of the record may have been created with voice recognition software. Occasional wrong-word or 'sound-a-like' substitutions may have occurred due to  the inherent limitations of voice recognition software.  Read the chart carefully and recognize, using context, where substitutions may have occurred.

## 2022-04-17 ENCOUNTER — Encounter
Admission: RE | Disposition: A | Discharge status: Home / Self Care | Payer: Self-pay | Source: Home / Self Care | Attending: Gastroenterology

## 2022-04-17 ENCOUNTER — Ambulatory Visit
Admission: RE | Admit: 2022-04-17 | Discharge: 2022-04-17 | Disposition: A | Discharge status: Home / Self Care | Payer: 59 | Attending: Gastroenterology | Admitting: Gastroenterology

## 2022-04-17 ENCOUNTER — Ambulatory Visit: Payer: 59 | Admitting: Anesthesiology

## 2022-04-17 ENCOUNTER — Encounter: Payer: Self-pay | Admitting: Gastroenterology

## 2022-04-17 DIAGNOSIS — D125 Benign neoplasm of sigmoid colon: Secondary | ICD-10-CM | POA: Diagnosis not present

## 2022-04-17 DIAGNOSIS — Z6837 Body mass index (BMI) 37.0-37.9, adult: Secondary | ICD-10-CM | POA: Insufficient documentation

## 2022-04-17 DIAGNOSIS — D123 Benign neoplasm of transverse colon: Secondary | ICD-10-CM | POA: Insufficient documentation

## 2022-04-17 DIAGNOSIS — E785 Hyperlipidemia, unspecified: Secondary | ICD-10-CM | POA: Diagnosis not present

## 2022-04-17 DIAGNOSIS — Z1211 Encounter for screening for malignant neoplasm of colon: Secondary | ICD-10-CM | POA: Diagnosis present

## 2022-04-17 DIAGNOSIS — E119 Type 2 diabetes mellitus without complications: Secondary | ICD-10-CM | POA: Diagnosis not present

## 2022-04-17 DIAGNOSIS — K640 First degree hemorrhoids: Secondary | ICD-10-CM | POA: Insufficient documentation

## 2022-04-17 DIAGNOSIS — Z7984 Long term (current) use of oral hypoglycemic drugs: Secondary | ICD-10-CM | POA: Insufficient documentation

## 2022-04-17 HISTORY — PX: COLONOSCOPY: SHX5424

## 2022-04-17 LAB — GLUCOSE, CAPILLARY: Glucose-Capillary: 110 mg/dL — ABNORMAL HIGH (ref 70–99)

## 2022-04-17 SURGERY — COLONOSCOPY
Anesthesia: General

## 2022-04-17 MED ORDER — SODIUM CHLORIDE 0.9 % IV SOLN
INTRAVENOUS | Status: DC
Start: 2022-04-17 — End: 2022-04-17

## 2022-04-17 MED ORDER — PROPOFOL 500 MG/50ML IV EMUL
INTRAVENOUS | Status: DC | PRN
  Administered 2022-04-17: 140 ug/kg/min via INTRAVENOUS

## 2022-04-17 MED ORDER — LIDOCAINE HCL (CARDIAC) PF 100 MG/5ML IV SOSY
PREFILLED_SYRINGE | INTRAVENOUS | Status: DC | PRN
  Administered 2022-04-17: 60 mg via INTRAVENOUS

## 2022-04-17 MED ORDER — PROPOFOL 10 MG/ML IV BOLUS
INTRAVENOUS | Status: DC | PRN
  Administered 2022-04-17: 20 mg via INTRAVENOUS
  Administered 2022-04-17: 80 mg via INTRAVENOUS
  Administered 2022-04-17: 20 mg via INTRAVENOUS

## 2022-04-17 MED ORDER — DEXMEDETOMIDINE (PRECEDEX) IN NS 20 MCG/5ML (4 MCG/ML) IV SYRINGE
PREFILLED_SYRINGE | INTRAVENOUS | Status: DC | PRN
  Administered 2022-04-17: 8 ug via INTRAVENOUS

## 2022-04-17 NOTE — Transfer of Care (Signed)
Immediate Anesthesia Transfer of Care Note  Patient: LEONEL MCCOLLUM  Procedure(s) Performed: COLONOSCOPY  Patient Location: PACU  Anesthesia Type:General  Level of Consciousness: awake, alert  and oriented  Airway & Oxygen Therapy: Patient Spontanous Breathing  Post-op Assessment: Report given to RN and Post -op Vital signs reviewed and stable  Post vital signs: Reviewed and stable  Last Vitals:  Vitals Value Taken Time  BP 104/62 04/17/22 0818  Temp 36.1 C 04/17/22 0818  Pulse 80 04/17/22 0818  Resp 11 04/17/22 0818  SpO2 98 % 04/17/22 0818    Last Pain:  Vitals:   04/17/22 0818  TempSrc: Temporal  PainSc: Asleep         Complications: No notable events documented.

## 2022-04-17 NOTE — Op Note (Signed)
[facility] Patient Name: Donald Baxter Procedure Date: 04/17/2022 7:25 AM MRN: [id number] Account #: [id number] Date of Birth: 25-Feb-1976 Admit Type: Outpatient Age: 46 Room: [facility] ENDO ROOM 1 Gender: Male Note Status: Finalized Instrument Name: Colonscope [id number] Procedure:             Colonoscopy Indications:           Screening for colorectal malignant neoplasm Providers:             Annamaria Helling DO, DO Referring MD:          Delsa Grana (Referring MD) Medicines:             Monitored Anesthesia Care Complications:         No immediate complications. Estimated blood loss:                         Minimal. Procedure:             Pre-Anesthesia Assessment:                        - Prior to the procedure, a History and Physical was                         performed, and patient medications and allergies were                         reviewed. The patient is competent. The risks and                         benefits of the procedure and the sedation options and                         risks were discussed with the patient. All questions                         were answered and informed consent was obtained.                         Patient identification and proposed procedure were                         verified by the physician, the nurse, the anesthetist                         and the technician in the endoscopy suite. Mental                         Status Examination: alert and oriented. Airway                         Examination: normal oropharyngeal airway and neck                         mobility. Respiratory Examination: clear to                         auscultation. CV Examination: RRR, no murmurs, no S3  or S4. Prophylactic Antibiotics: The patient does not                         require prophylactic antibiotics. Prior                         Anticoagulants: The patient has taken no previous                          anticoagulant or antiplatelet agents. ASA Grade                         Assessment: III - A patient with severe systemic                         disease. After reviewing the risks and benefits, the                         patient was deemed in satisfactory condition to                         undergo the procedure. The anesthesia plan was to use                         monitored anesthesia care (MAC). Immediately prior to                         administration of medications, the patient was                         re-assessed for adequacy to receive sedatives. The                         heart rate, respiratory rate, oxygen saturations,                         blood pressure, adequacy of pulmonary ventilation, and                         response to care were monitored throughout the                         procedure. The physical status of the patient was                         re-assessed after the procedure.                        After obtaining informed consent, the colonoscope was                         passed under direct vision. Throughout the procedure,                         the patient's blood pressure, pulse, and oxygen                         saturations were monitored continuously. The  Colonoscope was introduced through the anus and                         advanced to the the terminal ileum, with                         identification of the appendiceal orifice and IC                         valve. The colonoscopy was performed without                         difficulty. The patient tolerated the procedure well.                         The quality of the bowel preparation was evaluated                         using the BBPS [facility] Bowel Preparation Scale) with                         scores of: Right Colon = 3, Transverse Colon = 3 and                         Left Colon = 3 (entire mucosa seen well with no                         residual  staining, small fragments of stool or opaque                         liquid). The total BBPS score equals 9. The terminal                         ileum, ileocecal valve, appendiceal orifice, and                         rectum were photographed. Findings:      The perianal and digital rectal examinations were normal. Pertinent       negatives include normal sphincter tone.      The terminal ileum appeared normal. Estimated blood loss: none.      Two sessile polyps were found in the sigmoid colon and transverse colon.       The polyps were 2 to 3 mm in size. These polyps were removed with a cold       snare. Resection and retrieval were complete. Estimated blood loss was       minimal.      Non-bleeding internal hemorrhoids were found during retroflexion. The       hemorrhoids were Grade I (internal hemorrhoids that do not prolapse).       Estimated blood loss: none.      The exam was otherwise without abnormality on direct and retroflexion       views. Impression:            - The examined portion of the ileum was normal.                        - Two 2 to 3 mm polyps  in the sigmoid colon and in the                         transverse colon, removed with a cold snare. Resected                         and retrieved.                        - Non-bleeding internal hemorrhoids.                        - The examination was otherwise normal on direct and                         retroflexion views. Recommendation:        - Patient has a contact number available for                         emergencies. The signs and symptoms of potential                         delayed complications were discussed with the patient.                         Return to normal activities tomorrow. Written                         discharge instructions were provided to the patient.                        - Discharge patient to home.                        - Resume previous diet.                        - Continue  present medications.                        - No aspirin, ibuprofen, naproxen, or other                         non-steroidal anti-inflammatory drugs for 5 days after                         polyp removal.                        - Await pathology results.                        - Repeat colonoscopy for surveillance based on                         pathology results.                        - Return to referring physician as previously                         scheduled.                        -  The findings and recommendations were discussed with                         the patient. Procedure Code(s):     --- Professional ---                        [phone], Colonoscopy, flexible; with removal of                         tumor(s), polyp(s), or other lesion(s) by snare                         technique Diagnosis Code(s):     --- Professional ---                        Z12.11, Encounter for screening for malignant neoplasm                         of colon                        K64.0, First degree hemorrhoids                        K63.5, Polyp of colon CPT copyright 2019 American Medical Association. All rights reserved. The codes documented in this report are preliminary and upon coder review may  be revised to meet current compliance requirements. Attending Participation:      I personally performed the entire procedure. Volney American, DO Annamaria Helling DO, DO 04/17/2022 8:16:29 AM This report has been signed electronically. Number of Addenda: 0 Note Initiated On: 04/17/2022 7:25 AM Scope Withdrawal Time: 0 hours 21 minutes 43 seconds  Total Procedure Duration: 0 hours 27 minutes 28 seconds  Estimated Blood Loss:  Estimated blood loss was minimal.      [facility]

## 2022-04-17 NOTE — Interval H&P Note (Signed)
History and Physical Interval Note: Preprocedure H&P from 04/17/22  was reviewed and there was no interval change after seeing and examining the patient.  Written consent was obtained from the patient after discussion of risks, benefits, and alternatives. Patient has consented to proceed with Colonoscopy with possible intervention   04/17/2022 7:20 AM  Donald Baxter  has presented today for surgery, with the diagnosis of Colon cancer screening (Z12.11).  The various methods of treatment have been discussed with the patient and family. After consideration of risks, benefits and other options for treatment, the patient has consented to  Procedure(s) with comments: COLONOSCOPY (N/A) - DM as a surgical intervention.  The patient's history has been reviewed, patient examined, no change in status, stable for surgery.  I have reviewed the patient's chart and labs.  Questions were answered to the patient's satisfaction.     Annamaria Helling

## 2022-04-17 NOTE — Anesthesia Postprocedure Evaluation (Signed)
Anesthesia Post Note  Patient: Donald Baxter  Procedure(s) Performed: COLONOSCOPY  Patient location during evaluation: PACU Anesthesia Type: General Level of consciousness: awake and awake and alert Pain management: satisfactory to patient Vital Signs Assessment: post-procedure vital signs reviewed and stable Respiratory status: spontaneous breathing and nonlabored ventilation Cardiovascular status: stable Anesthetic complications: no   No notable events documented.   Last Vitals:  Vitals:   04/17/22 0828 04/17/22 0838  BP: 125/88 (!) 126/98  Pulse: 83 71  Resp: 14 12  Temp:    SpO2: 98% 98%    Last Pain:  Vitals:   04/17/22 0838  TempSrc:   PainSc: 0-No pain                 VAN STAVEREN,Giorgi Debruin

## 2022-04-17 NOTE — Anesthesia Preprocedure Evaluation (Signed)
Anesthesia Evaluation  Patient identified by MRN, date of birth, ID band Patient awake    Reviewed: Allergy & Precautions, NPO status , Patient's Chart, lab work & pertinent test results  Airway Mallampati: III  TM Distance: >3 FB Neck ROM: full    Dental  (+) Teeth Intact   Pulmonary neg pulmonary ROS,    Pulmonary exam normal breath sounds clear to auscultation       Cardiovascular Exercise Tolerance: Good negative cardio ROS Normal cardiovascular exam Rhythm:Regular     Neuro/Psych negative neurological ROS  negative psych ROS   GI/Hepatic negative GI ROS, Neg liver ROS,   Endo/Other  negative endocrine ROSdiabetes, Well Controlled, Type 2Morbid obesity  Renal/GU negative Renal ROS  negative genitourinary   Musculoskeletal negative musculoskeletal ROS (+)   Abdominal (+) + obese,   Peds negative pediatric ROS (+)  Hematology negative hematology ROS (+)   Anesthesia Other Findings Past Medical History: No date: Bulge of cervical disc without myelopathy     Comment:  between c3-c4 No date: Diabetes mellitus without complication (HCC) No date: Folliculitis No date: Hyperlipidemia No date: Low testosterone No date: Obesity No date: Psoriasis No date: Tarsal tunnel syndrome  Past Surgical History: No date: ANKLE SURGERY; Bilateral     Comment:  tarsal tunnel      Reproductive/Obstetrics negative OB ROS                             Anesthesia Physical Anesthesia Plan  ASA: 3  Anesthesia Plan: General   Post-op Pain Management:    Induction:   PONV Risk Score and Plan: Propofol infusion and TIVA  Airway Management Planned: Natural Airway  Additional Equipment:   Intra-op Plan:   Post-operative Plan:   Informed Consent: I have reviewed the patients History and Physical, chart, labs and discussed the procedure including the risks, benefits and alternatives for the  proposed anesthesia with the patient or authorized representative who has indicated his/her understanding and acceptance.     Dental Advisory Given  Plan Discussed with: CRNA and Surgeon  Anesthesia Plan Comments:         Anesthesia Quick Evaluation

## 2022-04-21 ENCOUNTER — Encounter: Payer: Self-pay | Admitting: Gastroenterology

## 2022-04-21 LAB — SURGICAL PATHOLOGY

## 2023-05-19 ENCOUNTER — Other Ambulatory Visit
Admission: RE | Admit: 2023-05-19 | Discharge: 2023-05-19 | Disposition: A | Discharge status: Home / Self Care | Payer: No Typology Code available for payment source | Attending: Urology | Admitting: Urology

## 2023-05-19 ENCOUNTER — Encounter: Payer: Self-pay | Admitting: Urology

## 2023-05-19 ENCOUNTER — Ambulatory Visit: Payer: No Typology Code available for payment source | Admitting: Urology

## 2023-05-19 VITALS — BP 131/85 | Ht 74.0 in | Wt 300.8 lb

## 2023-05-19 DIAGNOSIS — R7989 Other specified abnormal findings of blood chemistry: Secondary | ICD-10-CM

## 2023-05-19 DIAGNOSIS — N529 Male erectile dysfunction, unspecified: Secondary | ICD-10-CM | POA: Diagnosis not present

## 2023-05-19 DIAGNOSIS — R5383 Other fatigue: Secondary | ICD-10-CM

## 2023-05-19 LAB — VITAMIN D 25 HYDROXY (VIT D DEFICIENCY, FRACTURES): Vit D, 25-Hydroxy: 34.38 ng/mL (ref 30–100)

## 2023-05-19 NOTE — Patient Instructions (Addendum)
[employer] is an Therapist, occupational with generic medications that typically have the lowest prices for generic Viagra or Cialis, we can send your prescription there in the future if you desire  Hypogonadism, Male  Male hypogonadism is a condition of having a level of testosterone that is lower than normal. Testosterone is a chemical, or hormone, that is made mainly in the testicles. In boys, testosterone is responsible for the development of male characteristics during puberty. These include: Making the penis bigger. Growing and building the muscles. Growing facial hair. Deepening the voice. In adult men, testosterone is responsible for maintaining: An interest in sex and the ability to have sex. Muscle mass. Sperm production. Red blood cell production. Bone strength. Testosterone also gives men energy and a sense of well-being. Testosterone normally decreases as men age and the testicles make less testosterone. Testosterone levels can vary from man to man. Not all men will have signs and symptoms of low testosterone. Weight, alcohol use, medicines, and certain medical conditions can affect a man's testosterone level. What are the causes? This condition is caused by: A natural decrease in testosterone that occurs as a man grows older. This is the main cause of this condition. Use of medicines, such as antidepressants, steroids, and opioids. Diseases and conditions that affect the testicles or the making of testosterone. These include: Injury or damage to the testicles from trauma, cancer, cancer treatment, or infection. Diabetes. Sleep apnea. Genetic conditions that men are born with. Disease of the pituitary gland. This gland is in the brain. It produces hormones. Obesity. Metabolic syndrome. This is a group of diseases that affect blood pressure, blood sugar, cholesterol, and belly fat. HIV or AIDS. Alcohol abuse. Kidney failure. Other long-term or chronic diseases. What are  the signs or symptoms? Common symptoms of this condition include: Loss of interest in sex (low sex drive). Inability to have or maintain an erection (erectile dysfunction). Feeling tired (fatigue). Mood changes, like irritability or depression. Loss of muscle and body hair. Infertility. Large breasts. Weight gain (obesity). How is this diagnosed? Your health care provider can diagnose hypogonadism based on: Your signs and symptoms. A physical exam to check your testosterone levels. This includes blood tests. Testosterone levels can change throughout the day. Levels are highest in the morning. You may need to have repeat blood tests before getting a diagnosis of hypogonadism. Depending on your medical history and test results, your health care provider may also do other tests to find the cause of low testosterone. How is this treated? This condition is treated with testosterone replacement therapy. Testosterone can be given by: Injection or through pellets inserted under the skin. Gels or patches placed on the skin or in the mouth. Testosterone therapy is not for everyone. It has risks and side effects. Your health care provider will consider your medical history, your risk for prostate cancer, your age, and your symptoms before putting you on testosterone replacement therapy. Follow these instructions at home: Take over-the-counter and prescription medicines only as told by your health care provider. Eat foods that are high in fiber, such as beans, whole grains, and fresh fruits and vegetables. Limit foods that are high in fat and processed sugars, such as fried or sweet foods. If you drink alcohol: Limit how much you have to 0-2 drinks a day. Know how much alcohol is in your drink. In the [place] one drink equals one 12 oz bottle of beer (355 mL), one 5 oz glass of wine (148 mL), or one 1  oz glass of hard liquor (44 mL). Return to your normal activities as told by your health care provider.  Ask your health care provider what activities are safe for you. Keep all follow-up visits. This is important. Contact a health care provider if: You have any of the signs or symptoms of low testosterone. You have any side effects from testosterone therapy. Summary Male hypogonadism is a condition of having a level of testosterone that is lower than normal. The natural drop in testosterone production that occurs with age is the most common cause of this condition. Low testosterone can also be caused by many diseases and conditions that affect the testicles and the making of testosterone. This condition is treated with testosterone replacement therapy. There are risks and side effects of testosterone therapy. Your health care provider will consider your age, medical history, symptoms, and risks for prostate cancer before putting you on testosterone therapy. This information is not intended to replace advice given to you by your health care provider. Make sure you discuss any questions you have with your health care provider. Document Revised: 04/04/2020 Document Reviewed: 04/04/2020 Elsevier Patient Education  2024 [employer].

## 2023-05-19 NOTE — Progress Notes (Signed)
05/19/23 9:33 AM   Donald Baxter [birth year] [id number]  CC: ED, low testosterone  HPI: 47 year old male with a number of comorbidities including obesity with BMI of 39, uncontrolled diabetes with recent hemoglobin A1c 9.0 referred to discuss ED and low testosterone.  He was unable to have children, and does not desire biologic pregnancy at this point.  He reportedly was previously on testosterone injections briefly at least 10 years ago, also was trialed on Clomid for fertility issues previously.  He does not remember the outcome when he was taking those, but did not do well with injections as these were uncomfortable.  Testosterone level was low at 181 from 04/20/2023.  His primary complaints are fatigue, tiredness, ED, low libido.  He was started on sildenafil 25 mg on demand for ED which has worked well.  PSA was normal at 0.18 from June 2022.  PMH: Past Medical History:  Diagnosis Date   Bulge of cervical disc without myelopathy    between c3-c4   Diabetes mellitus without complication (HCC)    Folliculitis    Hyperlipidemia    Low testosterone    Obesity    Psoriasis    Tarsal tunnel syndrome     Surgical History: Past Surgical History:  Procedure Laterality Date   ANKLE SURGERY Bilateral    tarsal tunnel    COLONOSCOPY N/A 04/17/2022   Procedure: COLONOSCOPY;  Surgeon: Jaynie Collins, DO;  Location: [facility] ENDOSCOPY;  Service: Gastroenterology;  Laterality: N/A;  DM     Family History: Family History  Problem Relation Age of Onset   Hypertension Mother    Hyperlipidemia Mother    Kidney disease Mother    Diabetes Father    Hypertension Father    Hyperlipidemia Father    Cancer Maternal Grandmother        not sure what kind   Cancer Paternal Grandmother        lung?   Stroke Paternal Grandmother    Heart disease Paternal Grandfather        heart failure    Social History:  reports that he has never smoked. He has never used smokeless tobacco. He  reports current alcohol use. He reports that he does not use drugs.  Physical Exam: BP 131/85 (BP Location: Left Arm, Patient Position: Sitting, Cuff Size: Large)   Ht 6\' 2"  (1.88 m)   Wt (!) 300 lb 12.8 oz (136.4 kg)   BMI 38.62 kg/m    Constitutional:  Alert and oriented, No acute distress. Cardiovascular: No clubbing, cyanosis, or edema. Respiratory: Normal respiratory effort, no increased work of breathing. GI: Abdomen is soft, nontender, nondistended, no abdominal masses   Laboratory Data: Reviewed, see HPI   Assessment & Plan:   47 year old male referred for fatigue, tiredness, ED, and low testosterone level of 181.  Erections have improved with sildenafil 25 mg on demand.  We had a very frank conversation about possible etiology of his low testosterone including his poorly controlled diabetes and obesity.  We reviewed the AUA guidelines regarding testosterone evaluation and management.  We discussed treatment approaches to low testosterone starting with behavioral/diet with weight loss, exercise, better control of diabetes, a trial of off label Clomid to naturally increase the testosterone levels, or trial of testosterone replacement therapy through either injections, gels, or nasal sprays.  Risks and benefits were discussed extensively, and we specifically reviewed the risks of exogenous testosterone and the effect on testicular production of testosterone.  He is most interested in trying  Clomid if testosterone remains low.  -Testosterone, LH, vitamin D today-> if vitamin D level extremely low we will replace vitamin D prior to considering Clomid.  If testosterone very low and vitamin D normal can start Clomid 25mg /daily -Continue sildenafil on demand for ED  Legrand Rams, MD 05/19/2023  [facility] [address], [address] [place], [place] [address] [phone]

## 2023-05-20 LAB — LUTEINIZING HORMONE: LH: 8.2 m[IU]/mL (ref 1.7–8.6)

## 2023-05-20 LAB — TESTOSTERONE: Testosterone: 327 ng/dL (ref 264–916)

## 2023-05-21 ENCOUNTER — Other Ambulatory Visit: Payer: Self-pay

## 2023-05-21 DIAGNOSIS — R7989 Other specified abnormal findings of blood chemistry: Secondary | ICD-10-CM

## 2023-05-21 DIAGNOSIS — E559 Vitamin D deficiency, unspecified: Secondary | ICD-10-CM

## 2023-05-21 NOTE — Telephone Encounter (Signed)
Labs ordered. Appt scheduled.  

## 2023-11-23 ENCOUNTER — Ambulatory Visit: Payer: Self-pay | Admitting: Urology

## 2023-11-23 ENCOUNTER — Other Ambulatory Visit
Admission: RE | Admit: 2023-11-23 | Discharge: 2023-11-23 | Disposition: A | Discharge status: Home / Self Care | Attending: Urology | Admitting: Urology

## 2023-11-23 VITALS — BP 115/76 | HR 76 | Ht 74.0 in | Wt 302.2 lb

## 2023-11-23 DIAGNOSIS — R7989 Other specified abnormal findings of blood chemistry: Secondary | ICD-10-CM

## 2023-11-23 DIAGNOSIS — E559 Vitamin D deficiency, unspecified: Secondary | ICD-10-CM | POA: Insufficient documentation

## 2023-11-23 DIAGNOSIS — R5383 Other fatigue: Secondary | ICD-10-CM | POA: Diagnosis not present

## 2023-11-23 DIAGNOSIS — N529 Male erectile dysfunction, unspecified: Secondary | ICD-10-CM

## 2023-11-23 LAB — VITAMIN D 25 HYDROXY (VIT D DEFICIENCY, FRACTURES): Vit D, 25-Hydroxy: 50.7 ng/mL (ref 30–100)

## 2023-11-23 NOTE — Progress Notes (Signed)
   11/23/2023 9:21 AM   Noe Gens October 12, 1975 [id number]  Reason for visit: Follow up hypogonadism, ED, PSA screening  HPI: 48 year old male with a number of comorbidities including obesity(BMI 39), diabetes(hemoglobin A1c 7.9) who was referred for low testosterone and ED.  His initial testosterone with PCP from September 2024 was 181.  His primary complaints are fatigue, tiredness, ED, low libido.  PSA was normal at 0.18 in June 2022.  We rechecked levels based on the AUA guidelines, and testosterone was normal in October 2024 at 327.  We opted to supplement vitamin D which was low and work on behavioral strategies.  Sildenafil 25 to 75 mg on demand continues to work well for ED.  We discussed treatment options including lifestyle changes with weight loss, exercise, improve diabetes control.  He was interested in rechecking a testosterone level today, and if this is borderline or low could consider Clomid.  He reportedly has been on testosterone injections in the distant past which he did not do well with.  Testosterone, vitamin D, estradiol today, if testosterone low or borderline we will trial Clomid Continue sildenafil for ED, max dose 100 mg  Sondra Come, MD  [facility] [address], [address] [place], [place] [address] [phone]

## 2023-11-24 LAB — ESTRADIOL: Estradiol: 33.5 pg/mL (ref 7.6–42.6)

## 2023-11-24 LAB — TESTOSTERONE: Testosterone: 391 ng/dL (ref 264–916)
